# Patient Record
Sex: Female | Born: 1945 | Race: White | Hispanic: No
Health system: Southern US, Community
[De-identification: ages and names within clinical notes are randomized; demographics above are authoritative.]

## PROBLEM LIST (undated history)

## (undated) DIAGNOSIS — R7401 Elevation of levels of liver transaminase levels: Secondary | ICD-10-CM

## (undated) DIAGNOSIS — L659 Nonscarring hair loss, unspecified: Secondary | ICD-10-CM

## (undated) DIAGNOSIS — E119 Type 2 diabetes mellitus without complications: Secondary | ICD-10-CM

## (undated) DIAGNOSIS — I4891 Unspecified atrial fibrillation: Secondary | ICD-10-CM

## (undated) DIAGNOSIS — I10 Essential (primary) hypertension: Secondary | ICD-10-CM

## (undated) DIAGNOSIS — R740 Nonspecific elevation of levels of transaminase and lactic acid dehydrogenase [LDH]: Secondary | ICD-10-CM

---

## 2014-03-21 ENCOUNTER — Other Ambulatory Visit (HOSPITAL_COMMUNITY): Payer: Medicare Other

## 2014-03-21 ENCOUNTER — Inpatient Hospital Stay
Admission: AD | Admit: 2014-03-21 | Discharge: 2014-03-27 | Disposition: A | Discharge status: Nursing Home (Includes ICF) | Payer: Medicare Other | Source: Ambulatory Visit | Attending: Internal Medicine | Admitting: Internal Medicine

## 2014-03-21 DIAGNOSIS — K56609 Unspecified intestinal obstruction, unspecified as to partial versus complete obstruction: Secondary | ICD-10-CM

## 2014-03-21 DIAGNOSIS — Z4659 Encounter for fitting and adjustment of other gastrointestinal appliance and device: Secondary | ICD-10-CM

## 2014-03-21 DIAGNOSIS — R638 Other symptoms and signs concerning food and fluid intake: Secondary | ICD-10-CM

## 2014-03-21 DIAGNOSIS — Z978 Presence of other specified devices: Secondary | ICD-10-CM

## 2014-03-21 DIAGNOSIS — Z9911 Dependence on respirator [ventilator] status: Secondary | ICD-10-CM

## 2014-03-21 DIAGNOSIS — R069 Unspecified abnormalities of breathing: Secondary | ICD-10-CM

## 2014-03-21 DIAGNOSIS — J9690 Respiratory failure, unspecified, unspecified whether with hypoxia or hypercapnia: Secondary | ICD-10-CM

## 2014-03-21 DIAGNOSIS — K567 Ileus, unspecified: Secondary | ICD-10-CM

## 2014-03-21 DIAGNOSIS — H5000 Unspecified esotropia: Secondary | ICD-10-CM

## 2014-03-21 DIAGNOSIS — J9691 Respiratory failure, unspecified with hypoxia: Secondary | ICD-10-CM

## 2014-03-21 DIAGNOSIS — J9692 Respiratory failure, unspecified with hypercapnia: Secondary | ICD-10-CM

## 2014-03-21 DIAGNOSIS — J9600 Acute respiratory failure, unspecified whether with hypoxia or hypercapnia: Secondary | ICD-10-CM

## 2014-03-21 LAB — BLOOD GAS, ARTERIAL
Acid-Base Excess: 5.1 mmol/L — ABNORMAL HIGH (ref 0.0–2.0)
BICARBONATE: 29.8 meq/L — AB (ref 20.0–24.0)
FIO2: 0.4 %
MECHVT: 450 mL
O2 SAT: 96.4 %
PATIENT TEMPERATURE: 98.6
PEEP: 5 cmH2O
RATE: 16 resp/min
TCO2: 31.3 mmol/L (ref 0–100)
pCO2 arterial: 49.9 mmHg — ABNORMAL HIGH (ref 35.0–45.0)
pH, Arterial: 7.393 (ref 7.350–7.450)
pO2, Arterial: 86.4 mmHg (ref 80.0–100.0)

## 2014-03-22 ENCOUNTER — Other Ambulatory Visit (HOSPITAL_COMMUNITY): Payer: Medicare Other

## 2014-03-22 LAB — COMPREHENSIVE METABOLIC PANEL
ALT: 76 U/L — ABNORMAL HIGH (ref 0–35)
ANION GAP: 11 (ref 5–15)
AST: 24 U/L (ref 0–37)
Albumin: 2 g/dL — ABNORMAL LOW (ref 3.5–5.2)
Alkaline Phosphatase: 84 U/L (ref 39–117)
BUN: 23 mg/dL (ref 6–23)
CALCIUM: 8.9 mg/dL (ref 8.4–10.5)
CO2: 31 mEq/L (ref 19–32)
CREATININE: 0.87 mg/dL (ref 0.50–1.10)
Chloride: 94 mEq/L — ABNORMAL LOW (ref 96–112)
GFR calc non Af Amer: 66 mL/min — ABNORMAL LOW (ref >90)
GFR, EST AFRICAN AMERICAN: 77 mL/min — AB (ref >90)
Glucose, Bld: 222 mg/dL — ABNORMAL HIGH (ref 70–99)
Potassium: 3.5 mEq/L — ABNORMAL LOW (ref 3.7–5.3)
Sodium: 136 mEq/L — ABNORMAL LOW (ref 137–147)
TOTAL PROTEIN: 6.7 g/dL (ref 6.0–8.3)
Total Bilirubin: 0.9 mg/dL (ref 0.3–1.2)

## 2014-03-22 LAB — PROCALCITONIN: Procalcitonin: 0.26 ng/mL

## 2014-03-22 LAB — CBC
HEMATOCRIT: 35.7 % — AB (ref 36.0–46.0)
HEMOGLOBIN: 11.4 g/dL — AB (ref 12.0–15.0)
MCH: 30.9 pg (ref 26.0–34.0)
MCHC: 31.9 g/dL (ref 30.0–36.0)
MCV: 96.7 fL (ref 78.0–100.0)
Platelets: 293 10*3/uL (ref 150–400)
RBC: 3.69 MIL/uL — ABNORMAL LOW (ref 3.87–5.11)
RDW: 14.6 % (ref 11.5–15.5)
WBC: 15.5 10*3/uL — ABNORMAL HIGH (ref 4.0–10.5)

## 2014-03-22 LAB — PREALBUMIN: Prealbumin: 7.2 mg/dL — ABNORMAL LOW (ref 17.0–34.0)

## 2014-03-23 ENCOUNTER — Other Ambulatory Visit (HOSPITAL_COMMUNITY): Payer: Medicare Other

## 2014-03-23 LAB — BASIC METABOLIC PANEL
Anion gap: 13 (ref 5–15)
BUN: 28 mg/dL — AB (ref 6–23)
CALCIUM: 8.9 mg/dL (ref 8.4–10.5)
CO2: 31 meq/L (ref 19–32)
Chloride: 95 mEq/L — ABNORMAL LOW (ref 96–112)
Creatinine, Ser: 0.94 mg/dL (ref 0.50–1.10)
GFR calc Af Amer: 70 mL/min — ABNORMAL LOW (ref >90)
GFR, EST NON AFRICAN AMERICAN: 61 mL/min — AB (ref >90)
GLUCOSE: 246 mg/dL — AB (ref 70–99)
Potassium: 3.8 mEq/L (ref 3.7–5.3)
Sodium: 139 mEq/L (ref 137–147)

## 2014-03-23 LAB — MAGNESIUM: Magnesium: 2.2 mg/dL (ref 1.5–2.5)

## 2014-03-24 ENCOUNTER — Other Ambulatory Visit (HOSPITAL_COMMUNITY): Payer: Medicare Other

## 2014-03-24 LAB — COMPREHENSIVE METABOLIC PANEL
ALK PHOS: 83 U/L (ref 39–117)
ALT: 93 U/L — ABNORMAL HIGH (ref 0–35)
AST: 67 U/L — ABNORMAL HIGH (ref 0–37)
Albumin: 2 g/dL — ABNORMAL LOW (ref 3.5–5.2)
Anion gap: 9 (ref 5–15)
BUN: 23 mg/dL (ref 6–23)
CO2: 33 mEq/L — ABNORMAL HIGH (ref 19–32)
Calcium: 8.6 mg/dL (ref 8.4–10.5)
Chloride: 100 mEq/L (ref 96–112)
Creatinine, Ser: 0.8 mg/dL (ref 0.50–1.10)
GFR calc non Af Amer: 74 mL/min — ABNORMAL LOW (ref >90)
GFR, EST AFRICAN AMERICAN: 85 mL/min — AB (ref >90)
GLUCOSE: 204 mg/dL — AB (ref 70–99)
Potassium: 3.7 mEq/L (ref 3.7–5.3)
Sodium: 142 mEq/L (ref 137–147)
TOTAL PROTEIN: 6.2 g/dL (ref 6.0–8.3)
Total Bilirubin: 0.9 mg/dL (ref 0.3–1.2)

## 2014-03-24 LAB — BASIC METABOLIC PANEL
Anion gap: 9 (ref 5–15)
BUN: 24 mg/dL — AB (ref 6–23)
CO2: 33 mEq/L — ABNORMAL HIGH (ref 19–32)
CREATININE: 0.85 mg/dL (ref 0.50–1.10)
Calcium: 8.8 mg/dL (ref 8.4–10.5)
Chloride: 99 mEq/L (ref 96–112)
GFR calc Af Amer: 79 mL/min — ABNORMAL LOW (ref >90)
GFR, EST NON AFRICAN AMERICAN: 68 mL/min — AB (ref >90)
Glucose, Bld: 230 mg/dL — ABNORMAL HIGH (ref 70–99)
Potassium: 3.5 mEq/L — ABNORMAL LOW (ref 3.7–5.3)
Sodium: 141 mEq/L (ref 137–147)

## 2014-03-24 LAB — TRIGLYCERIDES: Triglycerides: 123 mg/dL (ref <150)

## 2014-03-24 LAB — CBC
HEMATOCRIT: 36.2 % (ref 36.0–46.0)
HEMOGLOBIN: 11.4 g/dL — AB (ref 12.0–15.0)
MCH: 31.2 pg (ref 26.0–34.0)
MCHC: 31.5 g/dL (ref 30.0–36.0)
MCV: 99.2 fL (ref 78.0–100.0)
Platelets: 363 10*3/uL (ref 150–400)
RBC: 3.65 MIL/uL — ABNORMAL LOW (ref 3.87–5.11)
RDW: 14.8 % (ref 11.5–15.5)
WBC: 11.2 10*3/uL — ABNORMAL HIGH (ref 4.0–10.5)

## 2014-03-24 LAB — PHOSPHORUS: Phosphorus: 2.7 mg/dL (ref 2.3–4.6)

## 2014-03-24 LAB — MAGNESIUM: MAGNESIUM: 2.1 mg/dL (ref 1.5–2.5)

## 2014-03-24 NOTE — Consult Note (Signed)
Name: Amanda Yang MRN: [id number] DOB: [birth year]    ADMISSION DATE:  03/21/2014 CONSULTATION DATE:  10/26  REFERRING MD :  Sharyon MedicusHijazi   CHIEF COMPLAINT:  Vent weaning  BRIEF PATIENT DESCRIPTION:  68 [facility] w/ acute hypoxic and hypercarbic respiratory failure in the setting of RLL PNA  And AMS. She failed NIPPV attempt and required intubation and admit to the ICU. Sputum culture demonstrated Group B strep and she was treated w/ appropriate antibiotic rx. She had passed SBT and was initially extubated on 10/20. She developed progressive worsening hypercarbia, once again failed BIPAP so was re-intubated. [facility] and possible consideration for trach.   SIGNIFICANT EVENTS  10/15: admitted to [place] w/ PNA, HCRF, AF w/ RVR and AMS  10/20: extubated, developed progressive HCRF, reintubated.  10/23: [facility] and possible trach   STUDIES:  [facility] negative for acute process    HISTORY OF PRESENT ILLNESS:   68 [facility] old [facility] w/ acute hypoxic and hypercarbic respiratory failure in the setting of RLL PNA  And AMS. She failed NIPPV attempt and required intubation and admit to the ICU. Sputum culture demonstrated Group B strep and she was treated w/ appropriate antibiotic rx. She had passed SBT and was initially extubated on 10/20. She developed progressive  worsening hypercarbia, once again failed BIPAP so was re-intubated. [facility] and possible consideration for trach.   PAST MEDICAL HISTORY :  Acute hypoxic and hypercarbic respiratory failure, CAP (gp B strep), prob OSA, atrial fib w/ episode of RVR, AKI, transeminitis, uncontrolled DM type II, HTN, morbid obesity, baseline cognition impairment, ileus, alopecia    medications   Digoxin, duoneb, lactulose, lemivir, reglan,  lisinopril, LMWH, pro-stat, protonix, Unasyn D51/2 NS, PRN fentanyl, lorazepam, oxy IR.    Allergies not on file Grass mix pollens   FAMILY HISTORY:  family history is not on file. SOCIAL HISTORY:   Lives alone. Has mild cognitive impairment but was able to work for ~ 30 years, drives, manages her own meds. Her brother is her medical POA. Gabriel Earingalvin Caspers.    REVIEW OF SYSTEMS:   No distress.   SUBJECTIVE:  No distress.  VITAL SIGNS:   Reviewed, sats 98%, afebrile  PHYSICAL EXAMINATION: General:  Obese no distress  Neuro:  Sleepy, appropriate. No distress.  HEENT:  Orally intubated. No JVD, alopecia  Cardiovascular:  Regular irregular  Lungs:  Scattered rhonchi w/out accessory muscle use. Only pulls ~ 300 ml vt w/ rr ~ 30 on 16 cmH2O support Abdomen:  Obese, distended, Hypoactive  Musculoskeletal:  Intact  Skin:  + anasarca, warm to touch    Recent Labs Lab 03/22/14 0640 03/23/14 0600 03/24/14 0500  NA 136* 139 141  K 3.5* 3.8 3.5*  CL 94* 95* 99  CO2 31 31 33*  BUN 23 28* 24*  CREATININE 0.87 0.94 0.85  GLUCOSE 222* 246* 230*    Recent Labs Lab 03/22/14 0640 03/24/14 0500  HGB 11.4* 11.4*  HCT 35.7* 36.2  WBC 15.5* 11.2*  PLT 293 363   ABG    Component Value Date/Time   PHART 7.393 03/21/2014 1905   PCO2ART 49.9* 03/21/2014 1905   PO2ART 86.4 03/21/2014 1905   HCO3 29.8* 03/21/2014 1905   TCO2 31.3 03/21/2014 1905   O2SAT 96.4 03/21/2014 1905    Dg  Chest Port 1 View  03/24/2014   CLINICAL DATA:  Respiratory failure.  EXAM: PORTABLE CHEST - 1 VIEW  COMPARISON:  03/22/2014  FINDINGS: Endotracheal tube is roughly 3.0 cm above the carina. Central line tip in the upper SVC region. Lung markings are mildly prominent but there is no focal airspace disease. Heart size is normal. Nasogastric tube extends into the abdomen. Negative for a pneumothorax.  IMPRESSION: Stable chest radiograph findings. Prominent lung markings without focal airspace disease.  Stable  support apparatuses.   Electronically Signed   By: Richarda OverlieAdam  Henn M.D.   On: 03/24/2014 08:08   Dg Abd Portable 1v  03/23/2014   CLINICAL DATA:  Ileus, abdominal distension  EXAM: PORTABLE ABDOMEN - 1 VIEW  COMPARISON:  Portable exam 1404 hr compared to 03/21/2014  FINDINGS: Air-filled large and small bowel loops throughout abdomen question ileus.  No bowel wall thickening or point of obstruction.  Tip of nasogastric tube projects over proximal descending duodenum.  Improved aeration in LEFT lower lobe versus previous exam.  Bones demineralized.  Scattered atherosclerotic calcification.  IMPRESSION: Question ileus.   Electronically Signed   By: Ulyses SouthwardMark  Boles M.D.   On: 03/23/2014 14:27  R>L airspace disease   ASSESSMENT / PLAN:  Acute on chronic hypoxic and hypercarbic respiratory failure Group B strep PNA. W/ persistent R basilar opacity  Probable OSA/OHS Suspect diastolic dysfxn and probable secondary PAH Ileus  Acute encephalopathy  DM w/ hyperglycemia  Anemia of critical illness Mild hypokalemia   Discussion Long interview w/ her brother Jerilynn SomCalvin. She lives independently, was still driving and managing her household semi-independently prior to [facility] [place] [facility]. She had about a 3 week decline w/ what sounds more consistent w/ decompensated OHS/OSA, then probably a super-imposed CAP. Currently she is on PSSV of 12 6 cmH2O. Her Vt on this barely bring her F/vt ratio less than 100. Certainly her ileus could be contributing to this; but suspect we are dealing more w/ a chronic problem of Obstructive sleep apnea +/- obesity hypoventilation syndrome. She is able to manage her own insulin at home. Very functional. Think that she would be able to provide trach care for herself if we go this route. The question would be if she needs nocturnal ventilation as well.   recs [facility], to goal 5/5 Continue to work on ileus: limit sedation, continue pro motility agents, mobilize Continue  ABX per primary service. Given nml PCT these could be stopped at anytime  Shoot for negative fluid balance if BUN.creatine and BP allow. Appears has plenty of room Think trach in next few days would be helpful, eventually decide on nocturnal vent or not We will need to call her Bother Gabriel EaringCalvin Stave [phone] prior to trach  Will need coags  Pulmonary and Critical Care Medicine [facility] Pager: [phone]  03/24/2014, 10:29 AM  I have fully examined this patient and agree with above findings.    And edited infull  Mcarthur RossettiDaniel J. Tyson AliasFeinstein, MD, FACP Pgr: [phone] [place] [facility]

## 2014-03-25 ENCOUNTER — Other Ambulatory Visit (HOSPITAL_COMMUNITY): Payer: Medicare Other

## 2014-03-25 DIAGNOSIS — Z789 Other specified health status: Secondary | ICD-10-CM

## 2014-03-25 DIAGNOSIS — R638 Other symptoms and signs concerning food and fluid intake: Secondary | ICD-10-CM

## 2014-03-25 DIAGNOSIS — Z4659 Encounter for fitting and adjustment of other gastrointestinal appliance and device: Secondary | ICD-10-CM

## 2014-03-25 DIAGNOSIS — H5000 Unspecified esotropia: Secondary | ICD-10-CM

## 2014-03-25 DIAGNOSIS — J9600 Acute respiratory failure, unspecified whether with hypoxia or hypercapnia: Secondary | ICD-10-CM

## 2014-03-25 LAB — CULTURE, RESPIRATORY

## 2014-03-25 LAB — CULTURE, RESPIRATORY W GRAM STAIN

## 2014-03-26 ENCOUNTER — Other Ambulatory Visit (HOSPITAL_COMMUNITY): Payer: Medicare Other

## 2014-03-26 LAB — BASIC METABOLIC PANEL
Anion gap: 8 (ref 5–15)
BUN: 27 mg/dL — ABNORMAL HIGH (ref 6–23)
CO2: 42 meq/L — AB (ref 19–32)
Calcium: 8.8 mg/dL (ref 8.4–10.5)
Chloride: 92 mEq/L — ABNORMAL LOW (ref 96–112)
Creatinine, Ser: 0.89 mg/dL (ref 0.50–1.10)
GFR calc Af Amer: 75 mL/min — ABNORMAL LOW (ref >90)
GFR calc non Af Amer: 65 mL/min — ABNORMAL LOW (ref >90)
Glucose, Bld: 214 mg/dL — ABNORMAL HIGH (ref 70–99)
Potassium: 3.2 mEq/L — ABNORMAL LOW (ref 3.7–5.3)
Sodium: 142 mEq/L (ref 137–147)

## 2014-03-26 LAB — CLOSTRIDIUM DIFFICILE BY PCR: CDIFFPCR: NEGATIVE

## 2014-03-26 LAB — PHOSPHORUS: Phosphorus: 3.4 mg/dL (ref 2.3–4.6)

## 2014-03-26 LAB — MAGNESIUM: Magnesium: 1.4 mg/dL — ABNORMAL LOW (ref 1.5–2.5)

## 2014-03-27 ENCOUNTER — Other Ambulatory Visit (HOSPITAL_COMMUNITY): Payer: Medicare Other

## 2014-03-27 ENCOUNTER — Encounter (HOSPITAL_COMMUNITY): Payer: Self-pay | Admitting: Adult Health

## 2014-03-27 ENCOUNTER — Inpatient Hospital Stay (HOSPITAL_COMMUNITY)
Admission: EM | Admit: 2014-03-27 | Discharge: 2014-04-02 | DRG: 207 | Disposition: A | Discharge status: Other Acute Inpatient Hospital | Payer: Medicare Other | Attending: Pulmonary Disease | Admitting: Pulmonary Disease

## 2014-03-27 DIAGNOSIS — I10 Essential (primary) hypertension: Secondary | ICD-10-CM | POA: Diagnosis present

## 2014-03-27 DIAGNOSIS — E871 Hypo-osmolality and hyponatremia: Secondary | ICD-10-CM | POA: Diagnosis present

## 2014-03-27 DIAGNOSIS — Z9911 Dependence on respirator [ventilator] status: Secondary | ICD-10-CM | POA: Diagnosis not present

## 2014-03-27 DIAGNOSIS — K8020 Calculus of gallbladder without cholecystitis without obstruction: Secondary | ICD-10-CM | POA: Diagnosis present

## 2014-03-27 DIAGNOSIS — I4891 Unspecified atrial fibrillation: Secondary | ICD-10-CM

## 2014-03-27 DIAGNOSIS — G9341 Metabolic encephalopathy: Secondary | ICD-10-CM | POA: Diagnosis present

## 2014-03-27 DIAGNOSIS — R40244 Other coma, without documented Glasgow coma scale score, or with partial score reported, unspecified time: Secondary | ICD-10-CM

## 2014-03-27 DIAGNOSIS — J15212 Pneumonia due to Methicillin resistant Staphylococcus aureus: Secondary | ICD-10-CM | POA: Diagnosis present

## 2014-03-27 DIAGNOSIS — Z22322 Carrier or suspected carrier of Methicillin resistant Staphylococcus aureus: Secondary | ICD-10-CM | POA: Diagnosis not present

## 2014-03-27 DIAGNOSIS — D649 Anemia, unspecified: Secondary | ICD-10-CM | POA: Diagnosis present

## 2014-03-27 DIAGNOSIS — R791 Abnormal coagulation profile: Secondary | ICD-10-CM | POA: Diagnosis present

## 2014-03-27 DIAGNOSIS — Z6836 Body mass index (BMI) 36.0-36.9, adult: Secondary | ICD-10-CM

## 2014-03-27 DIAGNOSIS — N179 Acute kidney failure, unspecified: Secondary | ICD-10-CM | POA: Diagnosis present

## 2014-03-27 DIAGNOSIS — J90 Pleural effusion, not elsewhere classified: Secondary | ICD-10-CM

## 2014-03-27 DIAGNOSIS — R404 Transient alteration of awareness: Secondary | ICD-10-CM

## 2014-03-27 DIAGNOSIS — G4733 Obstructive sleep apnea (adult) (pediatric): Secondary | ICD-10-CM | POA: Diagnosis present

## 2014-03-27 DIAGNOSIS — E119 Type 2 diabetes mellitus without complications: Secondary | ICD-10-CM | POA: Diagnosis present

## 2014-03-27 DIAGNOSIS — R109 Unspecified abdominal pain: Secondary | ICD-10-CM | POA: Diagnosis present

## 2014-03-27 DIAGNOSIS — R0603 Acute respiratory distress: Secondary | ICD-10-CM

## 2014-03-27 DIAGNOSIS — Z4659 Encounter for fitting and adjustment of other gastrointestinal appliance and device: Secondary | ICD-10-CM

## 2014-03-27 DIAGNOSIS — R6520 Severe sepsis without septic shock: Secondary | ICD-10-CM

## 2014-03-27 DIAGNOSIS — J9601 Acute respiratory failure with hypoxia: Secondary | ICD-10-CM

## 2014-03-27 DIAGNOSIS — J9620 Acute and chronic respiratory failure, unspecified whether with hypoxia or hypercapnia: Secondary | ICD-10-CM | POA: Insufficient documentation

## 2014-03-27 DIAGNOSIS — A419 Sepsis, unspecified organism: Secondary | ICD-10-CM

## 2014-03-27 DIAGNOSIS — J9690 Respiratory failure, unspecified, unspecified whether with hypoxia or hypercapnia: Secondary | ICD-10-CM

## 2014-03-27 DIAGNOSIS — R4182 Altered mental status, unspecified: Secondary | ICD-10-CM

## 2014-03-27 DIAGNOSIS — J9600 Acute respiratory failure, unspecified whether with hypoxia or hypercapnia: Secondary | ICD-10-CM

## 2014-03-27 HISTORY — DX: Unspecified atrial fibrillation: I48.91

## 2014-03-27 HISTORY — DX: Type 2 diabetes mellitus without complications: E11.9

## 2014-03-27 HISTORY — DX: Elevation of levels of liver transaminase levels: R74.01

## 2014-03-27 HISTORY — DX: Essential (primary) hypertension: I10

## 2014-03-27 HISTORY — DX: Nonspecific elevation of levels of transaminase and lactic acid dehydrogenase (ldh): R74.0

## 2014-03-27 HISTORY — DX: Morbid (severe) obesity due to excess calories: E66.01

## 2014-03-27 HISTORY — DX: Nonscarring hair loss, unspecified: L65.9

## 2014-03-27 LAB — URINALYSIS, ROUTINE W REFLEX MICROSCOPIC
Bilirubin Urine: NEGATIVE
Glucose, UA: NEGATIVE mg/dL
KETONES UR: 15 mg/dL — AB
NITRITE: NEGATIVE
Protein, ur: 30 mg/dL — AB
Specific Gravity, Urine: 1.041 — ABNORMAL HIGH (ref 1.005–1.030)
Urobilinogen, UA: 2 mg/dL — ABNORMAL HIGH (ref 0.0–1.0)
pH: 6 (ref 5.0–8.0)

## 2014-03-27 LAB — URINE MICROSCOPIC-ADD ON

## 2014-03-27 LAB — CBC
HCT: 33.3 % — ABNORMAL LOW (ref 36.0–46.0)
HCT: 35.4 % — ABNORMAL LOW (ref 36.0–46.0)
Hemoglobin: 10.1 g/dL — ABNORMAL LOW (ref 12.0–15.0)
Hemoglobin: 11.2 g/dL — ABNORMAL LOW (ref 12.0–15.0)
MCH: 29.9 pg (ref 26.0–34.0)
MCH: 31.7 pg (ref 26.0–34.0)
MCHC: 30.3 g/dL (ref 30.0–36.0)
MCHC: 31.6 g/dL (ref 30.0–36.0)
MCV: 98.5 fL (ref 78.0–100.0)
MCV: 100.3 fL — ABNORMAL HIGH (ref 78.0–100.0)
PLATELETS: 372 10*3/uL (ref 150–400)
Platelets: 389 10*3/uL (ref 150–400)
RBC: 3.38 MIL/uL — ABNORMAL LOW (ref 3.87–5.11)
RBC: 3.53 MIL/uL — AB (ref 3.87–5.11)
RDW: 14.7 % (ref 11.5–15.5)
RDW: 14.7 % (ref 11.5–15.5)
WBC: 9.1 10*3/uL (ref 4.0–10.5)
WBC: 9.6 10*3/uL (ref 4.0–10.5)

## 2014-03-27 LAB — PROTIME-INR
INR: 1.18 (ref 0.00–1.49)
PROTHROMBIN TIME: 15.1 s (ref 11.6–15.2)

## 2014-03-27 LAB — POCT I-STAT 3, ART BLOOD GAS (G3+)
ACID-BASE EXCESS: 20 mmol/L — AB (ref 0.0–2.0)
Bicarbonate: 46.3 mEq/L — ABNORMAL HIGH (ref 20.0–24.0)
O2 SAT: 93 %
TCO2: 48 mmol/L (ref 0–100)
pCO2 arterial: 56.6 mmHg — ABNORMAL HIGH (ref 35.0–45.0)
pH, Arterial: 7.521 — ABNORMAL HIGH (ref 7.350–7.450)
pO2, Arterial: 64 mmHg — ABNORMAL LOW (ref 80.0–100.0)

## 2014-03-27 LAB — PHOSPHORUS
PHOSPHORUS: 3.4 mg/dL (ref 2.3–4.6)
Phosphorus: 3.1 mg/dL (ref 2.3–4.6)

## 2014-03-27 LAB — BASIC METABOLIC PANEL
Anion gap: 8 (ref 5–15)
BUN: 27 mg/dL — ABNORMAL HIGH (ref 6–23)
CALCIUM: 8.5 mg/dL (ref 8.4–10.5)
CO2: 43 mEq/L (ref 19–32)
Chloride: 89 mEq/L — ABNORMAL LOW (ref 96–112)
Creatinine, Ser: 0.87 mg/dL (ref 0.50–1.10)
GFR calc Af Amer: 77 mL/min — ABNORMAL LOW (ref >90)
GFR calc non Af Amer: 66 mL/min — ABNORMAL LOW (ref >90)
GLUCOSE: 262 mg/dL — AB (ref 70–99)
Potassium: 3.2 mEq/L — ABNORMAL LOW (ref 3.7–5.3)
SODIUM: 140 meq/L (ref 137–147)

## 2014-03-27 LAB — HEPATIC FUNCTION PANEL
ALT: 157 U/L — ABNORMAL HIGH (ref 0–35)
AST: 95 U/L — ABNORMAL HIGH (ref 0–37)
Albumin: 2 g/dL — ABNORMAL LOW (ref 3.5–5.2)
Alkaline Phosphatase: 77 U/L (ref 39–117)
Bilirubin, Direct: 0.4 mg/dL — ABNORMAL HIGH (ref 0.0–0.3)
Indirect Bilirubin: 0.4 mg/dL (ref 0.3–0.9)
Total Bilirubin: 0.8 mg/dL (ref 0.3–1.2)
Total Protein: 6.1 g/dL (ref 6.0–8.3)

## 2014-03-27 LAB — MAGNESIUM
MAGNESIUM: 1.8 mg/dL (ref 1.5–2.5)
Magnesium: 1.8 mg/dL (ref 1.5–2.5)

## 2014-03-27 LAB — APTT: aPTT: 40 seconds — ABNORMAL HIGH (ref 24–37)

## 2014-03-27 LAB — GLUCOSE, CAPILLARY
GLUCOSE-CAPILLARY: 151 mg/dL — AB (ref 70–99)
GLUCOSE-CAPILLARY: 80 mg/dL (ref 70–99)

## 2014-03-27 LAB — PROCALCITONIN: Procalcitonin: 0.39 ng/mL

## 2014-03-27 LAB — MRSA PCR SCREENING: MRSA by PCR: POSITIVE — AB

## 2014-03-27 LAB — AMYLASE: Amylase: 34 U/L (ref 0–105)

## 2014-03-27 LAB — AMMONIA: Ammonia: 32 umol/L (ref 11–60)

## 2014-03-27 LAB — LACTIC ACID, PLASMA: Lactic Acid, Venous: 1.4 mmol/L (ref 0.5–2.2)

## 2014-03-27 LAB — DIGOXIN LEVEL: Digoxin Level: 1.2 ng/mL (ref 0.8–2.0)

## 2014-03-27 LAB — TRIGLYCERIDES: Triglycerides: 113 mg/dL (ref <150)

## 2014-03-27 LAB — LIPASE, BLOOD: LIPASE: 46 U/L (ref 11–59)

## 2014-03-27 MED ORDER — VANCOMYCIN HCL IN DEXTROSE 1-5 GM/200ML-% IV SOLN
1000.0000 mg | Freq: Two times a day (BID) | INTRAVENOUS | Status: DC
  Administered 2014-03-28 – 2014-03-29 (×3): 1000 mg via INTRAVENOUS
  Filled 2014-03-27 (×4): qty 200

## 2014-03-27 MED ORDER — MUPIROCIN 2 % EX OINT
1.0000 "application " | TOPICAL_OINTMENT | Freq: Two times a day (BID) | CUTANEOUS | Status: AC
  Administered 2014-03-27 – 2014-04-01 (×10): 1 via NASAL
  Filled 2014-03-27: qty 22

## 2014-03-27 MED ORDER — POTASSIUM CHLORIDE IN NACL 20-0.9 MEQ/L-% IV SOLN
INTRAVENOUS | Status: DC
  Administered 2014-03-27: 15:00:00 via INTRAVENOUS
  Administered 2014-03-28: 1000 mL via INTRAVENOUS
  Filled 2014-03-27 (×3): qty 1000

## 2014-03-27 MED ORDER — IPRATROPIUM-ALBUTEROL 0.5-2.5 (3) MG/3ML IN SOLN: 3.0000 mL | Freq: Four times a day (QID) | RESPIRATORY_TRACT | Status: DC

## 2014-03-27 MED ORDER — IOHEXOL 300 MG/ML  SOLN
100.0000 mL | Freq: Once | INTRAMUSCULAR | Status: AC | PRN
  Administered 2014-03-27: 100 mL via INTRAVENOUS

## 2014-03-27 MED ORDER — CETYLPYRIDINIUM CHLORIDE 0.05 % MT LIQD
7.0000 mL | Freq: Four times a day (QID) | OROMUCOSAL | Status: DC
  Administered 2014-03-28 – 2014-04-02 (×24): 7 mL via OROMUCOSAL

## 2014-03-27 MED ORDER — CHLORHEXIDINE GLUCONATE 0.12 % MT SOLN
15.0000 mL | Freq: Two times a day (BID) | OROMUCOSAL | Status: DC
  Administered 2014-03-27 – 2014-04-02 (×12): 15 mL via OROMUCOSAL
  Filled 2014-03-27 (×11): qty 15

## 2014-03-27 MED ORDER — PANTOPRAZOLE SODIUM 40 MG IV SOLR
40.0000 mg | Freq: Every day | INTRAVENOUS | Status: DC
  Administered 2014-03-27 – 2014-04-01 (×6): 40 mg via INTRAVENOUS
  Filled 2014-03-27 (×7): qty 40

## 2014-03-27 MED ORDER — PIPERACILLIN-TAZOBACTAM 3.375 G IVPB 30 MIN
3.3750 g | Freq: Once | INTRAVENOUS | Status: AC
  Administered 2014-03-27: 3.375 g via INTRAVENOUS

## 2014-03-27 MED ORDER — DIGOXIN 250 MCG PO TABS
0.2500 mg | ORAL_TABLET | Freq: Every day | ORAL | Status: DC
  Administered 2014-03-27 – 2014-04-01 (×6): 0.25 mg
  Filled 2014-03-27 (×8): qty 1

## 2014-03-27 MED ORDER — CHLORHEXIDINE GLUCONATE 0.12 % MT SOLN
15.0000 mL | Freq: Two times a day (BID) | OROMUCOSAL | Status: DC
Start: 2014-03-27 — End: 2014-03-27
  Administered 2014-03-27: 15 mL via OROMUCOSAL
  Filled 2014-03-27: qty 15

## 2014-03-27 MED ORDER — INSULIN ASPART 100 UNIT/ML ~~LOC~~ SOLN
0.0000 [IU] | SUBCUTANEOUS | Status: DC
  Administered 2014-03-27: 4 [IU] via SUBCUTANEOUS
  Administered 2014-03-28: 3 [IU] via SUBCUTANEOUS
  Administered 2014-03-29: 4 [IU] via SUBCUTANEOUS
  Administered 2014-03-29 – 2014-03-31 (×7): 3 [IU] via SUBCUTANEOUS
  Administered 2014-04-01: 7 [IU] via SUBCUTANEOUS
  Administered 2014-04-01 (×2): 4 [IU] via SUBCUTANEOUS
  Administered 2014-04-01 (×2): 3 [IU] via SUBCUTANEOUS
  Administered 2014-04-01: 4 [IU] via SUBCUTANEOUS
  Administered 2014-04-02: 3 [IU] via SUBCUTANEOUS

## 2014-03-27 MED ORDER — VANCOMYCIN HCL 10 G IV SOLR
2000.0000 mg | INTRAVENOUS | Status: AC
  Administered 2014-03-27: 2000 mg via INTRAVENOUS
  Filled 2014-03-27: qty 2000

## 2014-03-27 MED ORDER — IPRATROPIUM-ALBUTEROL 0.5-2.5 (3) MG/3ML IN SOLN
3.0000 mL | Freq: Four times a day (QID) | RESPIRATORY_TRACT | Status: DC
  Administered 2014-03-27 – 2014-04-02 (×23): 3 mL via RESPIRATORY_TRACT
  Filled 2014-03-27 (×24): qty 3

## 2014-03-27 MED ORDER — CHLORHEXIDINE GLUCONATE CLOTH 2 % EX PADS
6.0000 | MEDICATED_PAD | Freq: Every day | CUTANEOUS | Status: AC
  Administered 2014-03-28 – 2014-04-01 (×5): 6 via TOPICAL

## 2014-03-27 MED ORDER — PIPERACILLIN-TAZOBACTAM 3.375 G IVPB
3.3750 g | Freq: Three times a day (TID) | INTRAVENOUS | Status: DC
  Administered 2014-03-27 – 2014-04-01 (×14): 3.375 g via INTRAVENOUS
  Filled 2014-03-27 (×16): qty 50

## 2014-03-27 MED ORDER — CETYLPYRIDINIUM CHLORIDE 0.05 % MT LIQD: 7.0000 mL | Freq: Four times a day (QID) | OROMUCOSAL | Status: DC

## 2014-03-27 MED ORDER — INSULIN GLARGINE 100 UNIT/ML ~~LOC~~ SOLN
5.0000 [IU] | Freq: Every day | SUBCUTANEOUS | Status: DC
  Administered 2014-03-27 – 2014-04-01 (×6): 5 [IU] via SUBCUTANEOUS
  Filled 2014-03-27 (×7): qty 0.05

## 2014-03-27 MED ORDER — SODIUM CHLORIDE 0.9 % IV SOLN: 250.0000 mL | INTRAVENOUS | Status: DC | PRN

## 2014-03-27 NOTE — H&P (Signed)
PULMONARY / CRITICAL CARE MEDICINE   Name: Amanda Yang MRN: [id number] DOB: [birth year]    ADMISSION DATE:  03/27/2014  REFERRING MD :  Admit from [facility]   CHIEF COMPLAINT:  ?necrotic bowel  INITIAL PRESENTATION:  68yo female initially admitted to [facility] with ETT for vent wean/ ?trach in setting PNA, Afib RVR.  Developed worsening AMS and n/v and CT abd concerning for necrotic bowel.  Pt tx 10/29 to [facility] ER for surgical eval/ admission.    STUDIES:  10/29 CT Ab/Pelvis >>  Free air from possible necrotic small bowel, cholilithiasis, small bilateral pleural effusions and bibasilar atelectasis   SIGNIFICANT EVENTS: 10/29 tx to [facility] from [facility]    HISTORY OF PRESENT ILLNESS:   68yo female with hx DM, AFib, AKI, HTN, baseline cognitive impairment initially admitted to [facility] 10/15 with acute respiratory failure r/t PNA.  Failed bipap, requiring intubation, rx with abx for Group B strep but failed attempt at extubation.  Re-intubated and tx to [facility] 10/23 for weaning and ?trach.  Course has been c/b AKI, metabolic encephalopathy, transaminitis.  While in [facility] developed nausea and vomiting, Abd Xray revealed ?SBO and CT abd was done which was concerning for ischemic bowel and pt was tx to [place] for admission and surgical evaluation.    PAST MEDICAL HISTORY :   has a past medical history of Hypertension; A-fib; Diabetes; Morbid obesity; Alopecia; and Transaminitis.  has no past surgical history on file.   Prior to Admission medications   Not on File   Allergies  Allergen Reactions  . Pollen Extract     FAMILY HISTORY:  has no family status information on file.  SOCIAL HISTORY:  unknown   REVIEW OF SYSTEMS:  Unable, AMS  SUBJECTIVE:   VITAL SIGNS: Temp:  [99.8 F (37.7 C)] 99.8 F (37.7 C) (10/29 1327) Pulse Rate:  [90-103] 103 (10/29 1437) Resp:  [16-19] 19 (10/29 1437) BP: (103-118)/(65-67) 106/67 mmHg (10/29 1437) SpO2:  [95 %-99 %] 95 % (10/29  1437) FiO2 (%):  [35 %] 35 % (10/29 1437) Weight:  [240 lb (108.863 kg)] 240 lb (108.863 kg) (10/29 1327) HEMODYNAMICS:   VENTILATOR SETTINGS: Vent Mode:  [-] PRVC FiO2 (%):  [35 %] 35 % Set Rate:  [16 bmp] 16 bmp Vt Set:  [450 mL] 450 mL PEEP:  [5 cmH20] 5 cmH20 Plateau Pressure:  [23 cmH20] 23 cmH20 INTAKE / OUTPUT: No intake or output data in the 24 hours ending 03/27/14 1522  PHYSICAL EXAMINATION: General:  Ill-appearing obese female, appears older than stated age, vented Neuro:  Responsive to pain, pupils 2mm and minimally responsive to light  HEENT:  Oral mucosa dry, lips cracked and peeling, no scleral icterus, crusty drainage in R ear. Cardiovascular:  RRR, no MGR Lungs:  Diminished at the bases, crackles on the RLL with intermittent wheeze Abdomen:  Obese, firm, +BS Muscleskeletal:  Moves all four extremities (not purposely) in response to pain Skin:  Face pale, dry, pitting edema in BL UE 2+, LE 1+, hyperpigmentation to LE,   LABS:  CBC  Recent Labs Lab 03/24/14 0500 03/27/14 1300 03/27/14 1434  WBC 11.2* 9.1 9.6  HGB 11.4* 10.1* 11.2*  HCT 36.2 33.3* 35.4*  PLT 363 389 372   Coag's  Recent Labs Lab 03/27/14 0500  APTT 40*  INR 1.18   BMET  Recent Labs Lab 03/24/14 1200 03/26/14 0815 03/27/14 0500  NA 142 142 140  K 3.7 3.2* 3.2*  CL 100 92*  89*  CO2 33* 42* 43*  BUN 23 27* 27*  CREATININE 0.80 0.89 0.87  GLUCOSE 204* 214* 262*   Electrolytes  Recent Labs Lab 03/24/14 1200 03/26/14 0815 03/27/14 0500 03/27/14 1434  CALCIUM 8.6 8.8 8.5  --   MG 2.1 1.4* 1.8 1.8  PHOS 2.7 3.4 3.1 3.4   Sepsis Markers  Recent Labs Lab 03/22/14 0640 03/27/14 1434  LATICACIDVEN  --  1.4  PROCALCITON 0.26  --    ABG  Recent Labs Lab 03/21/14 1905  PHART 7.393  PCO2ART 49.9*  PO2ART 86.4   Liver Enzymes  Recent Labs Lab 03/22/14 0640 03/24/14 1200 03/27/14 0500  AST 24 67* 95*  ALT 76* 93* 157*  ALKPHOS 84 83 77  BILITOT 0.9 0.9  0.8  ALBUMIN 2.0* 2.0* 2.0*   Cardiac Enzymes No results found for this basename: TROPONINI, PROBNP,  in the last 168 hours Glucose No results found for this basename: GLUCAP,  in the last 168 hours  Imaging Dg Chest Port 1 View  03/26/2014   CLINICAL DATA:  Respirator weaning, shortness of breath.  EXAM: PORTABLE CHEST - 1 VIEW  COMPARISON:  03/25/2014  FINDINGS: Endotracheal tube tip 2.3 cm proximal to the carina. Right IJ catheter tip projects over the proximal SVC. NG tube descends below the level of the image. Aortic tortuosity. Heart size upper normal to mildly enlarged. Mild interstitial and hazy right greater than left infrahilar airspace opacities. Small effusions not excluded. No pneumothorax. No interval osseous change.  IMPRESSION: Similar appearance to the support devices as above.  Mild infrahilar opacities may reflect atelectasis, infiltrate, and/or edema.   Electronically Signed   By: Jearld LeschAndrew  DelGaizo M.D.   On: 03/26/2014 04:12   ASSESSMENT / PLAN:  PULMONARY OETT 10/20 ([name])>>> A:  Acute on chronic respiratory failure requiring prolonged ventilation RLL PNA OSA with hypoventilation syndrome P:   Fully vented - cont current vent settings  F/u ABG's Serial CXR BD's - duonebs, PRN albuterol  Consider trach   CARDIOVASCULAR CVL R IJ CVL ([name])>>> HTN  AFib RVR - currently NSR, previously on eliquis  SIRS  P:  Cont digoxin  Tele  Hold B blocker, Ace for now with relatively soft BP Check lactate, pct  ??anticoagulation - hold for now with ?need for OR and hematuria but will need to determine anticoagulation needs  RENAL AKI - resolved.  Hyponatremia  Hematuria  P:   F/u chem  Replete K PRN  Check ua  GASTROINTESTINAL ABd pain, n/v  Hx ileus  ?Ischemic bowel - free air on CT abd  transaminitis - suspect r/t hypoperfusion in setting SIRS/sepsis  cholelithiasis  P:   Surgery following  Currently no indication for urgent exp lap - afebrile,  wbc ok, lactate wnl, minimal abd distention Will admit to [facility] ICU for 24-48h to observe for need to possible explore abd F/u LFT's  Check amylase, lipase  NPO for now - previously on TPN r/t concern for un-resolved ileus v SBO  HEMATOLOGIC Coagulopathy - in setting full dose lovenox P:  Check coags  F/u cbc   INFECTIOUS A:   ??Perforated bowel, necrotic bowel Previous RLL PNA P:   BCx2 10/29>>> UC 10/29>>> Sputum 10/24 ([facility])>>> MRSA  Unasyn 10/15>>>10/29 Vanc 10/29>>> Zosyn 10/29>>> Flagyl 10/29>>>  F/u pct  Narrow abx quickly as able -- low threshold to narrow with no fever, wbc ok, s/p 14 day course unasyn    ENDOCRINE DM  P:   SSI, lantus  NEUROLOGIC A:  Metabolic encephalopathy +/- hepatic - appears significantly worse, ?etiology  Baseline cognitive impairment  P:   RASS goal: -1 Hold sedation  Consider CT head  Hold lactulose for now with ??GI status    FAMILY  - Updates: no family available 10/29  Dirk DressKaty Whiteheart, NP 03/27/2014  3:22 PM Pager: [phone] or [phone]  Will admit to the ICU, surgery to evaluate for OR needs.  No signs of active infection at this time but given CT findings will continue with abx.  F/u on cultures.  Will need discussion with family regarding code status prior to proceeding with tracheostomy.  I have personally obtained a history, examined the patient, evaluated laboratory and imaging results, formulated the assessment and plan and placed orders.  CRITICAL CARE: The patient is critically ill with multiple organ systems failure and requires high complexity decision making for assessment and support, frequent evaluation and titration of therapies, application of advanced monitoring technologies and extensive interpretation of multiple databases. Critical Care Time devoted to patient care services described in this note is 45 minutes.   *Care during the described time interval was provided by me and/or other  providers on the critical care team. I have reviewed this patient's available data, including medical history, events of note, physical examination and test results as part of my evaluation.  Alyson ReedyWesam G. Yacoub, M.D. [facility] Pulmonary/Critical Care Medicine. Pager: [phone]. After hours pager: [phone].

## 2014-03-27 NOTE — Consult Note (Signed)
The patient's CT findings are very concerning, although I cannot understand why a CT was done in the first place since clinically she does not seem to have changed.  I was able to communicate with the patient and directly asked her if she were having abdominal pain and she shook her head "no".  I repeated this in front of nursing staff who saw her respond to me.  She has normal active bowel sounds.  She is not acidotic.  She is not hypotensive or requiring pressors.  Her WBC is normal.  At this point her CT findings do not match with her clinical examination, which is benign. We will continue to follow her, but currently there are no indications that she needs surgery.  Marta LamasJames O. Gae BonWyatt, III, MD, FACS [phone]--pager [phone]--office [facility]

## 2014-03-27 NOTE — Progress Notes (Signed)
ANTIBIOTIC CONSULT NOTE - INITIAL  Pharmacy Consult for vancomycin + zosyn Indication: ?perf, ischemic bowel  Allergies  Allergen Reactions  . Pollen Extract     Patient Measurements: Height: 5\' 4"  (162.6 cm) Weight: 240 lb (108.863 kg) IBW/kg (Calculated) : 54.7 Adjusted Body Weight:   Vital Signs: Temp: 99.8 F (37.7 C) (10/29 1327) Temp Source: Axillary (10/29 1327) BP: 103/65 mmHg (10/29 1330) Pulse Rate: 92 (10/29 1330) Intake/Output from previous day:   Intake/Output from this shift:    Labs:  Recent Labs  03/26/14 0815 03/27/14 0500 03/27/14 1300  WBC  --   --  9.1  HGB  --   --  10.1*  PLT  --   --  389  CREATININE 0.89 0.87  --    Estimated Creatinine Clearance: 73.6 ml/min (by C-G formula based on Cr of 0.87). No results found for this basename: VANCOTROUGH, Leodis BinetVANCOPEAK, VANCORANDOM, GENTTROUGH, GENTPEAK, GENTRANDOM, TOBRATROUGH, TOBRAPEAK, TOBRARND, AMIKACINPEAK, AMIKACINTROU, AMIKACIN,  in the last 72 hours   Microbiology: Recent Results (from the past 720 hour(s))  CULTURE, RESPIRATORY (NON-EXPECTORATED)     Status: None   Collection Time    03/22/14  1:49 PM      Result Value Ref Range Status   Specimen Description TRACHEAL ASPIRATE   Final   Special Requests NONE   Final   Gram Stain     Final   Value: FEW WBC PRESENT, PREDOMINANTLY PMN     RARE SQUAMOUS EPITHELIAL CELLS PRESENT     NO ORGANISMS SEEN     Performed at [facility]   Culture     Final   Value: FEW METHICILLIN RESISTANT STAPHYLOCOCCUS AUREUS     Note: RIFAMPIN AND GENTAMICIN SHOULD NOT BE USED AS SINGLE DRUGS FOR TREATMENT OF STAPH INFECTIONS. This organism DOES NOT demonstrate inducible Clindamycin resistance in vitro. CRITICAL RESULT CALLED TO, READ BACK BY AND VERIFIED WITH: S.ADAMS RN @820       10.27.15 BY OANA     Performed at [facility]   Report Status 03/25/2014 FINAL   Final   Organism ID, Bacteria METHICILLIN RESISTANT STAPHYLOCOCCUS AUREUS   Final   CLOSTRIDIUM DIFFICILE BY PCR     Status: None   Collection Time    03/26/14 10:11 AM      Result Value Ref Range Status   C difficile by pcr NEGATIVE  NEGATIVE Final    Medical History: No past medical history on file.  Medications:  Anti-infectives   Start     Dose/Rate Route Frequency Ordered Stop   03/28/14 0300  vancomycin (VANCOCIN) IVPB 1000 mg/200 mL premix     1,000 mg 200 mL/hr over 60 Minutes Intravenous Every 12 hours 03/27/14 1429     03/27/14 2100  piperacillin-tazobactam (ZOSYN) IVPB 3.375 g     3.375 g 12.5 mL/hr over 240 Minutes Intravenous Every 8 hours 03/27/14 1429     03/27/14 1430  vancomycin (VANCOCIN) 2,000 mg in sodium chloride 0.9 % 500 mL IVPB     2,000 mg 250 mL/hr over 120 Minutes Intravenous STAT 03/27/14 1428 03/28/14 1430   03/27/14 1430  piperacillin-tazobactam (ZOSYN) IVPB 3.375 g     3.375 g 100 mL/hr over 30 Minutes Intravenous  Once 03/27/14 1428       Assessment: 68 yof transferred from [facility] to possible bowel perf or ischemic bowel. To start broad-spectrum antibiotics with vancomycin + zosyn for coverage. Pt had previously been on Unasyn prior to transfer. Pt is afebrile and WBC  is WNL. Scr is also WNL.   Vanc 10/29>> Zosyn 10/29>> Unasyn PTA>>10/29  10/24 TA - MRSA 10/28 CD[facility] - NEG  Goal of Therapy:  Vancomycin trough level 15-20 mcg/ml  Plan:  1. Vancomycin 2gm IV x 1 then 1gm IV Q12H 2. Zosyn 3.375gm IV x 1 over 30 min then 3.375gm IV Q8H (4 hr inf) 3. F/u renal fxn, C&S, clinical status and [facility]  Leldon Steege, Drake Leachachel Lynn 03/27/2014,2:30 PM

## 2014-03-27 NOTE — Progress Notes (Signed)
Pt transported on vent to ICU w/ no apparent complications.  RT given report.

## 2014-03-27 NOTE — Progress Notes (Signed)
eLink Physician-Brief Progress Note Patient Name: Amanda CharonMartha J Yang DOB: 1945/07/02 MRN: [id number]   Date of Service  03/27/2014  HPI/Events of Note  Respiratory failure Ischemic bowel? Surgery seeing Stable on vent  eICU Interventions  No intervention needed     Intervention Category Evaluation Type: New Patient Evaluation  Amanda Yang 03/27/2014, 4:40 PM

## 2014-03-27 NOTE — Progress Notes (Signed)
eLink Physician-Brief Progress Note Patient Name: Amanda CharonMartha J Yang DOB: 1945/11/28 MRN: [id number]   Date of Service  03/27/2014  HPI/Events of Note  Respiratory alkalosis  eICU Interventions  Decrease RR to 14, suspect baseline pCO2 60     Intervention Category Major Interventions: Respiratory failure - evaluation and management  MCQUAID, DOUGLAS 03/27/2014, 4:59 PM

## 2014-03-27 NOTE — Progress Notes (Signed)
Rec'd pt from [facility] hospital and placed pt on same vent settings.  Waiting on MD to eval and for vent orders.

## 2014-03-27 NOTE — ED Notes (Signed)
Patient sent from [facility] facility upstairs because CT scan showed ischemic bowel, chronic ventilator/ETT patient.  Patient responds to pain, 20 gauge left ac, central line right subclavian.  ETT in place, NG tube in place

## 2014-03-27 NOTE — Progress Notes (Signed)
[facility]                                                                                              Progress note     Patient Demographics  Amanda SievingMartha Yang, is a 68 y.o. female  RUE:[id number]  JYN:[id number]  DOB - 1945/10/24  Admit date - 03/27/2014  Admitting Physician No admitting provider for patient encounter.  Outpatient Primary MD for the patient is No primary provider on file.  LOS - 0   Chief Complaint  Patient presents with  . Ischemic bowel- CC consult      H&P and hospital course  Patient is a 68 year old female who was [facility]/2014 with respiratory failure and atrial fibrillation with rapid ventricular rate and was treated for right lower lobe aspiration pneumonia with Streptococcus B. with Unasyn . she had a septic-like picture at that time and improved she was placed on mechanical ventilation and failed extubation so was transferred to a hospital with ET tube. Weaning efforts were started however patient started having nausea and vomiting in the last 2-3 days. Her abdominal x-ray showed possible small bowel obstruction so a CT scan was done today and showed pneumatosis worrisome for devitalized small bowel and surgical consultation was recommended. Discussed with pulmonary critical care and surgery and the patient will be sent to the emergency room for evaluation by surgery. Patient has history of atrial fibrillation and is on digoxin and she is on treatment dose of Lovenox 100 mg subcutaneous twice a day.        Subjective:   Amanda Yang could not give any complaints due to intubation  Objective:   Vital signs  Temperature114 Heart rate 101 Respiratory rate 28 Blood pressure 114/68  Pulse ox 98%    Exam  obtunded  [place].AT,PERRAL ET tube in place  Supple Neck,No JVD, No cervical lymphadenopathy appriciated.  Symmetrical  Chest wall movement,  decreased respiratory sounds   and irregularly irregular ,No Gallops,Rubs or new Murmurs, No Parasternal Heave  decreased  B.Sounds, Abd Soft, No organomegaly appriciated, No rebound - guarding or rigidity. No Cyanosis, Clubbing or2 edema, No new Rash or bruise     I&Os minus 05/30/2006     OG tube  Foley - yes  ET tube        Data Review   CBC  Recent Labs Lab 03/22/14 0640 03/24/14 0500  WBC 15.5* 11.2*  HGB 11.4* 11.4*  HCT 35.7* 36.2  PLT 293 363  MCV 96.7 99.2  MCH 30.9 31.2  MCHC 31.9 31.5  RDW 14.6 14.8    Chemistries   Recent Labs Lab 03/22/14 0640 03/23/14 0600 03/24/14 0500 03/24/14 1200 03/26/14 0815 03/27/14 0500  NA 136* 139 141 142 142 140  K 3.5* 3.8 3.5* 3.7 3.2* 3.2*  CL 94* 95* 99 100 92* 89*  CO2 31 31 33* 33* 42* 43*  GLUCOSE 222* 246* 230* 204* 214* 262*  BUN 23 28* 24* 23 27* 27*  CREATININE 0.87 0.94 0.85 0.80 0.89 0.87  CALCIUM 8.9 8.9 8.8 8.6 8.8 8.5  MG  --  2.2  --  2.1 1.4* 1.8  AST 24  --   --  67*  --  95*  ALT 76*  --   --  93*  --  157*  ALKPHOS 84  --   --  83  --  77  BILITOT 0.9  --   --  0.9  --  0.8   ------------------------------------------------------------------------------------------------------------------ estimated creatinine clearance is 73.6 ml/min (by C-G formula based on Cr of 0.87). ------------------------------------------------------------------------------------------------------------------ No results found for this basename: HGBA1C,  in the last 72 hours ------------------------------------------------------------------------------------------------------------------  Recent Labs  03/27/14 0500  TRIG 113   ------------------------------------------------------------------------------------------------------------------ No results found for this basename: TSH, T4TOTAL, FREET3, T3FREE, THYROIDAB,  in the last 72  hours ------------------------------------------------------------------------------------------------------------------ No results found for this basename: VITAMINB12, FOLATE, FERRITIN, TIBC, IRON, RETICCTPCT,  in the last 72 hours  Coagulation profile  Recent Labs Lab 03/27/14 0500  INR 1.18    No results found for this basename: DDIMER,  in the last 72 hours  Cardiac Enzymes No results found for this basename: CK, CKMB, TROPONINI, MYOGLOBIN,  in the last 168 hours ------------------------------------------------------------------------------------------------------------------ No components found with this basename: POCBNP,   Micro Results Recent Results (from the past 240 hour(s))  CULTURE, RESPIRATORY (NON-EXPECTORATED)     Status: None   Collection Time    03/22/14  1:49 PM      Result Value Ref Range Status   Specimen Description TRACHEAL ASPIRATE   Final   Special Requests NONE   Final   Gram Stain     Final   Value: FEW WBC PRESENT, PREDOMINANTLY PMN     RARE SQUAMOUS EPITHELIAL CELLS PRESENT     NO ORGANISMS SEEN     Performed at [facility]   Culture     Final   Value: FEW METHICILLIN RESISTANT STAPHYLOCOCCUS AUREUS     Note: RIFAMPIN AND GENTAMICIN SHOULD NOT BE USED AS SINGLE DRUGS FOR TREATMENT OF STAPH INFECTIONS. This organism DOES NOT demonstrate inducible Clindamycin resistance in vitro. CRITICAL RESULT CALLED TO, READ BACK BY AND VERIFIED WITH: S.ADAMS RN @820       10.27.15 BY OANA     Performed at [facility]   Report Status 03/25/2014 FINAL   Final   Organism ID, Bacteria METHICILLIN RESISTANT STAPHYLOCOCCUS AUREUS   Final  CLOSTRIDIUM DIFFICILE BY PCR     Status: None   Collection Time    03/26/14 10:11 AM      Result Value Ref Range Status   C difficile by pcr NEGATIVE  NEGATIVE Final       Assessment & Plan   Bowel perforation/ischemic bowel by CT of abdomen Active fibrillation with rapid ventricular rates on Lovenox and  digoxin Respiratory failure/ET tube with history of SIRS resolved Protein calorie malnutrition was on TPN Diabetes mellitus on Lantus and insulin sliding scale  Plan  Transfer to ER Discussed with PCCM Discussed with surgery for surgical evaluation Code Status:  Full  Family Communication:  Discussed with brother   Antibiotics  started on vancomycin and Zosyn and Flagyl   Time Spent in minutes  43 minutes   DVT Prophylaxis  Lovenox treatment dose    Carron CurieHijazi, [place]rak[place] M.D on 03/27/2014 at 1:35 PM

## 2014-03-27 NOTE — ED Notes (Signed)
Critical care at bedside  

## 2014-03-27 NOTE — Consult Note (Signed)
Amanda CharonMartha J Yang August 25, 1945  [id number].   Requesting MD: Dr. Carron CurieAli Hijazi Chief Complaint/Reason for Consult: pneumatosis of small bowel HPI: This is a 68 yo white female with [facility] admission [facility] with CAP and respiratory failure.  She was intubated there with SIRS, acute kidney injury, a fib, metabolic encephalopathy, etc.  She was [facility] 03[facility]-14 for weaning purposes.  All of this information is gathered from the chart.  There is no family present and the patient is obtunded on the ventilator.  Apparently, she was trying to wean within the last couple of days and developed some nausea and vomiting.  She was noted to have an ileus in [facility].  [facility] which revealed an ileus vs obstruction.  A CT scan was then ordered today.  I have no idea if the patient actually had abdominal pain or not prior to her CT scan or now even.  Her CT scan showed pneumatosis with some mesenteric air vs portal venous gas.  [facility].  ROS: Unable to obtain as the patient is obtunded on the ventilator.  Family History: Unable to obtain  Past Medical History  Diagnosis Date  . Hypertension   . A-fib   . Diabetes   . Morbid obesity   . Alopecia   . Transaminitis     Past Surgical History: Unable to obtain given obtunded state and ventilator dependence  Social History:  Unable to obtain   Allergies:  Allergies  Allergen Reactions  . Pollen Extract      (Not in a hospital admission)  Blood pressure 106/67, pulse 103, temperature 99.8 F (37.7 C), temperature source Axillary, resp. rate 19, height 5\' 4"  (1.626 m), weight 240 lb (108.863 kg), SpO2 95.00%. Physical Exam: General: ill-appearing white female who is laying in bed on the ventilator HEENT: head is normocephalic, atraumatic, with no hair secondary to alopecia.    Ears and nose without any masses or lesions, dried cerumen present in her  external right auditory canal.  Mouth is pink with ETT in place Heart: regular, rate, and rhythm.  Normal s1,s2. No obvious murmurs, gallops, or rubs noted.  Palpable radial and pedal pulses bilaterally Lungs: CTAB, no wheezes, rhonchi, or rales noted.  Respiratory effort nonlabored Abd: soft, unable to assess tenderness, obese, hypoactive BS, no masses, hernias, or organomegaly MS: all 4 extremities are symmetrical with no cyanosis or clubbing Skin: warm and dry with no masses, lesions, or rashes Psych: obtunded on ventilator, unable to perform psych eval    Results for orders placed during the hospital encounter of 03/27/14 (from the past 48 hour(s))  MAGNESIUM     Status: None   Collection Time    03/27/14  2:34 PM      Result Value Ref Range   Magnesium 1.8  1.5 - 2.5 mg/dL  PHOSPHORUS     Status: None   Collection Time    03/27/14  2:34 PM      Result Value Ref Range   Phosphorus 3.4  2.3 - 4.6 mg/dL  AMYLASE     Status: None   Collection Time    03/27/14  2:34 PM      Result Value Ref Range   Amylase 34  0 - 105 U/L  LIPASE, BLOOD     Status: None   Collection Time    03/27/14  2:34 PM      Result Value Ref Range  Lipase 46  11 - 59 U/L  LACTIC ACID, PLASMA     Status: None   Collection Time    03/27/14  2:34 PM      Result Value Ref Range   Lactic Acid, Venous 1.4  0.5 - 2.2 mmol/L  CBC     Status: Abnormal   Collection Time    03/27/14  2:34 PM      Result Value Ref Range   WBC 9.6  4.0 - 10.5 K/uL   RBC 3.53 (*) 3.87 - 5.11 MIL/uL   Hemoglobin 11.2 (*) 12.0 - 15.0 g/dL   HCT 91.435.4 (*) 78.236.0 - 95.646.0 %   MCV 100.3 (*) 78.0 - 100.0 fL   MCH 31.7  26.0 - 34.0 pg   MCHC 31.6  30.0 - 36.0 g/dL   RDW 21.314.7  08.611.5 - 57.815.5 %   Platelets 372  150 - 400 K/uL   Ct Abdomen Pelvis W Contrast  03/27/2014   CLINICAL DATA:  Evaluate small bowel obstruction  EXAM: CT ABDOMEN AND PELVIS WITH CONTRAST  TECHNIQUE: Multidetector CT imaging of the abdomen and pelvis was performed  using the standard protocol following bolus administration of intravenous contrast.  CONTRAST:  100mL OMNIPAQUE IOHEXOL 300 MG/ML  SOLN  COMPARISON:  None.  FINDINGS: Small bilateral pleural effusions right greater than left with dependent atelectasis.  Small bowel loops in the lower abdomen have an abnormal appearance. This is characterized by pneumatosis and extraluminal bowel gas in the adjacent mesenteric. This may represent free intraperitoneal gas or portal venous gas. See images 59 through 69.  There is slight stranding in the mesenteric. No abscess. No free-fluid. Proximal bowel loops are mildly distended. No obvious cause of obstruction. No evidence of twisting of bowel loops. SMA is patent without obvious thrombus.  Gallstones without gallbladder wall thickening. Gallbladder wall calcification.  NG tube tip in the antrum.  Liver, spleen, pancreas, and right adrenal gland are within normal limits. Left adrenal gland is nodular without focal mass.  Calcified soft tissue area in the hilum of the spleen on image 30 measuring 2.3 cm may represent a small splenic artery aneurysm.  Multiple hypodensities in the right kidney are nonspecific. Chronic changes of the left kidney. No hydronephrosis.  Diverticulosis of the colon without evidence of diverticulitis.  There is stranding within the subcutaneous fat of both flanks worse on the left likely related to overall edema.  L5 superior endplate fracture versus large Schmorl's node. This does have a chronic appearance. Vacuum disc at L5-S1. Degenerative disc disease scattered throughout the lumbar spine.  Foley catheter decompresses the bladder. Uterus is absent. Right adnexa is unremarkable. 4.0 x 2.9 cm left adnexal partially calcified soft tissue area.  IMPRESSION: There is pneumatosis an adjacent extraluminal bowel gas as described worrisome for devitalized small bowel. Surgical consultation in is recommended. Critical Value/emergent results were called by  telephone at the time of interpretation on 03/27/2014 at 12:02 pm to Dr. Carron CurieALI HIJAZI , who verbally acknowledged these results.  Cholelithiasis.  Small bilateral pleural effusions and bibasilar atelectasis.  2.3 cm splenic artery aneurysm is not excluded. CT angiogram may better characterize.  Nonspecific right renal hypodensities.  4.0 cm left adnexal mass is nonspecific. This would be uncharacteristic as an ovary based on the patient's age. Ultrasound may be helpful.   Electronically Signed   By: Maryclare BeanArt  Hoss M.D.   On: 03/27/2014 12:03   Dg Chest Port 1 View  03/27/2014   CLINICAL DATA:  Respiratory  failure  EXAM: PORTABLE CHEST - 1 VIEW  COMPARISON:  03/26/2014  FINDINGS: Endotracheal tube in good position. Right jugular catheter tip in the SVC. NG tube enters the stomach.  Improved aeration. Decrease in bibasilar atelectasis. Negative for edema or effusion.  IMPRESSION: Support lines remain in good position.  Improvement in bibasilar atelectasis.   Electronically Signed   By: Marlan Palau M.D.   On: 03/27/2014 08:14   Dg Chest Port 1 View  03/26/2014   CLINICAL DATA:  Respirator weaning, shortness of breath.  EXAM: PORTABLE CHEST - 1 VIEW  COMPARISON:  03/25/2014  FINDINGS: Endotracheal tube tip 2.3 cm proximal to the carina. Right IJ catheter tip projects over the proximal SVC. NG tube descends below the level of the image. Aortic tortuosity. Heart size upper normal to mildly enlarged. Mild interstitial and hazy right greater than left infrahilar airspace opacities. Small effusions not excluded. No pneumothorax. No interval osseous change.  IMPRESSION: Similar appearance to the support devices as above.  Mild infrahilar opacities may reflect atelectasis, infiltrate, and/or edema.   Electronically Signed   By: Jearld Lesch M.D.   On: 03/26/2014 04:12   Dg Abd Portable 1v  03/25/2014   CLINICAL DATA:  68 year old female with vomiting and abdominal distension 1 week ago. Evaluate nasogastric tube  position. Initial encounter.  EXAM: PORTABLE ABDOMEN - 1 VIEW  COMPARISON:  03/23/2014.  FINDINGS: Nasogastric tube tip directed inferiorly at the expected level of the second portion of the duodenum.  Abnormal bowel gas pattern with gas distended small bowel loops with thickened folds. There is gas with an slightly prominent size colon. Findings may reflect underlying ileus although obstruction cannot be excluded. The degree of bowel wall thickening appears more prominent than on the prior exam.  The possibility of free intraperitoneal air cannot be assessed on a supine view.  IMPRESSION: Feeding tube tip proximal duodenal level.  Progressive abnormal bowel gas pattern as noted above.   Electronically Signed   By: Bridgett Larsson M.D.   On: 03/25/2014 15:38       Assessment/Plan 1. Pneumatosis of small bowel, on CT scan 2. VDRF 3. Possible AMS, do not know baseline 4. OSA 5. A fib 6. DM 7. Morbid obesity 8. Metabolic encephalopathy, apparently has some baseline cognitive impairment as well 9. H/o recent ileus 10. Alopecia 11. Elevated troponins at [facility] thought to be secondary to a fib with RVR  Plan: 1. The patient currently has a normal WBC which has been normalizing over the last couple of days to [facility] today.  She is AF and HR is in the 90s.  She is obtunded currently, but does not grimace or have any reaction to palpation on her abdomen.  We will not plan on emergent surgical intervention and perform serial abdominal exams and follow her vitals.  It is possible that her gut is still viable and she does not need an operation.  We agree with admission for close observation for the next 24-48 hrs.  If the patient were to acutely worsen, she may require surgical exploration.   Oriyah Lamphear E 03/27/2014, 3:13 PM Pager: [phone]

## 2014-03-28 ENCOUNTER — Inpatient Hospital Stay (HOSPITAL_COMMUNITY): Payer: Medicare Other

## 2014-03-28 DIAGNOSIS — J9620 Acute and chronic respiratory failure, unspecified whether with hypoxia or hypercapnia: Secondary | ICD-10-CM

## 2014-03-28 DIAGNOSIS — A419 Sepsis, unspecified organism: Secondary | ICD-10-CM

## 2014-03-28 DIAGNOSIS — R6520 Severe sepsis without septic shock: Secondary | ICD-10-CM

## 2014-03-28 LAB — CBC
HCT: 35.5 % — ABNORMAL LOW (ref 36.0–46.0)
Hemoglobin: 10.9 g/dL — ABNORMAL LOW (ref 12.0–15.0)
MCH: 30.3 pg (ref 26.0–34.0)
MCHC: 30.7 g/dL (ref 30.0–36.0)
MCV: 98.6 fL (ref 78.0–100.0)
Platelets: 414 10*3/uL — ABNORMAL HIGH (ref 150–400)
RBC: 3.6 MIL/uL — ABNORMAL LOW (ref 3.87–5.11)
RDW: 14.6 % (ref 11.5–15.5)
WBC: 9.1 10*3/uL (ref 4.0–10.5)

## 2014-03-28 LAB — GLUCOSE, CAPILLARY
Glucose-Capillary: 71 mg/dL (ref 70–99)
Glucose-Capillary: 98 mg/dL (ref 70–99)
Glucose-Capillary: 94 mg/dL (ref 70–99)
Glucose-Capillary: 115 mg/dL — ABNORMAL HIGH (ref 70–99)
Glucose-Capillary: 105 mg/dL — ABNORMAL HIGH (ref 70–99)
Glucose-Capillary: 128 mg/dL — ABNORMAL HIGH (ref 70–99)

## 2014-03-28 LAB — BASIC METABOLIC PANEL
ANION GAP: 9 (ref 5–15)
BUN: 23 mg/dL (ref 6–23)
CHLORIDE: 89 meq/L — AB (ref 96–112)
CO2: 41 meq/L — AB (ref 19–32)
Calcium: 8.3 mg/dL — ABNORMAL LOW (ref 8.4–10.5)
Creatinine, Ser: 1.04 mg/dL (ref 0.50–1.10)
GFR calc Af Amer: 62 mL/min — ABNORMAL LOW (ref >90)
GFR calc non Af Amer: 54 mL/min — ABNORMAL LOW (ref >90)
Glucose, Bld: 94 mg/dL (ref 70–99)
POTASSIUM: 3.2 meq/L — AB (ref 3.7–5.3)
SODIUM: 139 meq/L (ref 137–147)

## 2014-03-28 LAB — PHOSPHORUS: PHOSPHORUS: 3.1 mg/dL (ref 2.3–4.6)

## 2014-03-28 LAB — MAGNESIUM: Magnesium: 1.6 mg/dL (ref 1.5–2.5)

## 2014-03-28 MED ORDER — HEPARIN SODIUM (PORCINE) 5000 UNIT/ML IJ SOLN
5000.0000 [IU] | Freq: Three times a day (TID) | INTRAMUSCULAR | Status: DC
  Administered 2014-03-28 – 2014-04-02 (×16): 5000 [IU] via SUBCUTANEOUS
  Filled 2014-03-28 (×15): qty 1

## 2014-03-28 MED ORDER — KCL IN DEXTROSE-NACL 40-5-0.45 MEQ/L-%-% IV SOLN
INTRAVENOUS | Status: DC
  Administered 2014-03-28 – 2014-03-29 (×2): via INTRAVENOUS
  Administered 2014-03-30: 1000 mL via INTRAVENOUS
  Administered 2014-03-30 – 2014-04-01 (×3): via INTRAVENOUS
  Filled 2014-03-28 (×9): qty 1000

## 2014-03-28 NOTE — Progress Notes (Signed)
INITIAL NUTRITION ASSESSMENT  DOCUMENTATION CODES Per approved criteria  -Morbid Obesity   INTERVENTION:  Consider TPN if unable to start enteral nutrition within the next 7-8 days.  NUTRITION DIAGNOSIS: Inadequate oral intake related to inability to eat as evidenced by NPO status.   Goal: Nutrition support to provide 60-70% of estimated calorie needs (22-25 kcals/kg ideal body weight) and 100% of estimated protein needs, based on ASPEN guidelines for hypocaloric, high protein feeding in critically ill obese individuals  Monitor:  Weight trend, labs, vent status, ability to initiate nutrition support.  Reason for Assessment: VDRF  68 y.o. female  Admitting Dx: ? Necrotic Bowel  ASSESSMENT: 68 yo female initially admitted [facility] with ETT for vent wean/ ?trach in setting PNA, Afib RVR. Developed worsening AMS and n/v and CT abd concerning for necrotic bowel. [facility] for surgical eval/ admission.   Unable to complete nutrition focused physical exam at this time. Patient with ? necrotic bowel. Plans to continue full code status at this time per physician note. No surgery planned at this time per Surgery Team note.  Patient is currently intubated on ventilator support MV: 8.9 L/min Temp (24hrs), Avg:98.9 F (37.2 C), Min:98.4 F (36.9 C), Max:99.8 F (37.7 C)   Height: Ht Readings from Last 1 Encounters:  03/27/14 5\' 4"  (1.626 m)    Weight: Wt Readings from Last 1 Encounters:  03/28/14 236 lb 15.9 oz (107.5 kg)    Ideal Body Weight: 54.5 kg  % Ideal Body Weight: 197%  Wt Readings from Last 10 Encounters:  03/28/14 236 lb 15.9 oz (107.5 kg)    Usual Body Weight: unknown  % Usual Body Weight: n/a  BMI:  Body mass index is 40.66 kg/(m^2). class 3, extreme/morbid obesity  Estimated Nutritional Needs: Kcal: 1818 Hypocaloric feeding goal: 1200-1300 Protein: 109-136 gm Fluid: 2 L  Skin: unstageable pressure ulcer to sacrum  Diet Order:  NPO  EDUCATION NEEDS: -Education not appropriate at this time   Intake/Output Summary (Last 24 hours) at 03/28/14 1122 Last data filed at 03/28/14 0800  Gross per 24 hour  Intake 2010.5 ml  Output   1895 ml  Net  115.5 ml    Last BM: 10/29   Labs:   Recent Labs Lab 03/26/14 0815 03/27/14 0500 03/27/14 1434 03/28/14 0430  NA 142 140  --  139  K 3.2* 3.2*  --  3.2*  CL 92* 89*  --  89*  CO2 42* 43*  --  41*  BUN 27* 27*  --  23  CREATININE 0.89 0.87  --  1.04  CALCIUM 8.8 8.5  --  8.3*  MG 1.4* 1.8 1.8 1.6  PHOS 3.4 3.1 3.4 3.1  GLUCOSE 214* 262*  --  94    CBG (last 3)   Recent Labs  03/28/14 0038 03/28/14 0419 03/28/14 0822  GLUCAP 71 98 94    Scheduled Meds: . antiseptic oral rinse  7 mL Mouth Rinse QID  . chlorhexidine  15 mL Mouth Rinse BID  . Chlorhexidine Gluconate Cloth  6 each Topical Q0600  . digoxin  0.25 mg Per Tube Daily  . heparin subcutaneous  5,000 Units Subcutaneous 3 times per day  . insulin aspart  0-20 Units Subcutaneous 6 times per day  . insulin glargine  5 Units Subcutaneous QHS  . ipratropium-albuterol  3 mL Nebulization Q6H WA  . mupirocin ointment  1 application Nasal BID  . pantoprazole (PROTONIX) IV  40 mg Intravenous  QHS  . piperacillin-tazobactam (ZOSYN)  IV  3.375 g Intravenous Q8H  . vancomycin  1,000 mg Intravenous Q12H    Continuous Infusions: . dextrose 5 % and 0.45 % NaCl with KCl 40 mEq/L 75 mL/hr at 03/28/14 1042    Past Medical History  Diagnosis Date  . Hypertension   . A-fib   . Diabetes   . Morbid obesity   . Alopecia   . Transaminitis     History reviewed. No pertinent past surgical history.   Joaquin CourtsKimberly Harris, RD, LDN, CNSC Pager [phone] After Hours Pager [phone]

## 2014-03-28 NOTE — Progress Notes (Signed)
Discussed patient's clinical status extensively with brother, Gabriel EaringCalvin Majewski, who is her sole HCPOA. For now he would like us to keep her full code with full medical/surgical management. He however remains realistic about expectations and would like to be reached should she deteriorate further.  [phone].  [facility], MD

## 2014-03-28 NOTE — Progress Notes (Signed)
PULMONARY / CRITICAL CARE MEDICINE  Name: Amanda Yang  MRN: [id number]  DOB: 12-Feb-1946   ADMISSION DATE: 03/27/2014  REFERRING MD : Admit from [facility]   INITIAL PRESENTATION:  68yo female initially admitted to [facility] with ETT for vent wean/ ?trach in setting PNA, Afib RVR. Developed worsening AMS and n/v and CT abd concerning for necrotic bowel. [facility] for [facility] eval/ admission.   STUDIES:  10/29 CT Ab/Pelvis: Free air from possible necrotic small bowel, cholilithiasis, small bilateral pleural effusions and bibasilar atelectasis   SIGNIFICANT EVENTS:  10/29 tx to [facility] from [facility]  10/29 CCS consult: No indication for immediate surgery. CCS to to continue to follow clinically   SUBJECTIVE:  Pt is alert this morning, attempting to answer yes/no questions, and gesturing with hands. Denies any pain, shakes head no when asked.  No acute events noted over night, only adjustments to vent settings for respiratory alkalosis.   VITAL SIGNS:  Temp: [98.4 F (36.9 C)-99.8 F (37.7 C)] 98.4 F (36.9 C) (10/30 0823)  Pulse Rate: [72-105] 98 (10/30 1125)  Resp: [0-21] 20 (10/30 1125)  BP: (87-131)/(42-90) 122/90 mmHg (10/30 1125)  SpO2: [95 %-100 %] 95 % (10/30 1125)  FiO2 (%): [35 %-40 %] 40 % (10/30 1125)  Weight: [236 lb 15.9 oz (107.5 kg)-240 lb (108.863 kg)] 236 lb 15.9 oz (107.5 kg) (10/30 0500)  HEMODYNAMICS:   VENTILATOR SETTINGS:  Vent Mode: [-] PRVC  FiO2 (%): [35 %-40 %] 40 %  Set Rate: [14 bmp-16 bmp] 14 bmp  Vt Set: [450 mL] 450 mL  PEEP: [5 cmH20] 5 cmH20  Plateau Pressure: [20 cmH20-27 cmH20] 26 cmH20  INTAKE / OUTPUT:   Intake/Output Summary (Last 24 hours) at 03/28/14 1141 Last data filed at 03/28/14 0800   Gross per 24 hour   Intake  2010.5 ml   Output  1895 ml   Net  115.5 ml    PHYSICAL EXAMINATION:  General: Intubated, sedated, NAD Neuro: diffusely weak, no focal deficits, cognition appears intact HEENT: WNL Lungs: Diminished  throughout Abdomen: Obese, mildly distended, absent BS, non tender or minimally tender, no rebound or guarding  Ext: BUE edema, no LE edema   LABS:  CBC   Recent Labs  Lab  03/27/14 1300  03/27/14 1434  03/28/14 0430   WBC  9.1  9.6  9.1   HGB  10.1*  11.2*  10.9*   HCT  33.3*  35.4*  35.5*   PLT  389  372  414*    Coag's   Recent Labs  Lab  03/27/14 0500   APTT  40*   INR  1.18    BMET   Recent Labs  Lab  03/26/14 0815  03/27/14 0500  03/28/14 0430   NA  142  140  139   K  3.2*  3.2*  3.2*   CL  92*  89*  89*   CO2  42*  43*  41*   BUN  27*  27*  23   CREATININE  0.89  0.87  1.04   GLUCOSE  214*  262*  94    Electrolytes   Recent Labs  Lab  03/26/14 0815  03/27/14 0500  03/27/14 1434  03/28/14 0430   CALCIUM  8.8  8.5  --  8.3*   MG  1.4*  1.8  1.8  1.6   PHOS  3.4  3.1  3.4  3.1    Sepsis  Markers   Recent Labs  Lab  03/22/14 0640  03/27/14 1434   LATICACIDVEN  --  1.4   PROCALCITON  0.26  0.39    ABG   Recent Labs  Lab  03/21/14 1905  03/27/14 1643   PHART  7.393  7.521*   PCO2ART  49.9*  56.6*   PO2ART  86.4  64.0*    Liver Enzymes   Recent Labs  Lab  03/22/14 0640  03/24/14 1200  03/27/14 0500   AST  24  67*  95*   ALT  76*  93*  157*   ALKPHOS  84  83  77   BILITOT  0.9  0.9  0.8   ALBUMIN  2.0*  2.0*  2.0*    Cardiac Enzymes  No results found for this basename: TROPONINI, PROBNP, in the last 168 hours  Glucose   Recent Labs  Lab  03/27/14 1655  03/27/14 2038  03/28/14 0038  03/28/14 0419  03/28/14 0822   GLUCAP  151*  80  71  98  94    CXR: IS prominan  ASSESSMENT / PLAN:  PULMONARY  OETT 10/20 ([name])>>>  A:  Acute on chronic respiratory failure requiring prolonged ventilation  RLL PNA with Abx therapy completed  OSA with hypoventilation syndrome  P:  Cont full vent support - settings reviewed and/or adjusted Cont vent bundle Daily SBT if/when meets criteria Cont nebulized BDs  CARDIOVASCULAR    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  Hyponatremia - resolved  Hypokalemic and Hypochloremic - likely due to diarrhea and NGT  Hematuria - gross w/o UTI  P:  Monitor BMET intermittently Monitor I/Os Correct electrolytes as indicated  GASTROINTESTINAL  Abd pain, improving to resolved Ileus  ?Ischemic bowel - free air on CT abd  Elevated LFTs,  resolved  cholelithiasis with calcified gallbladder, w/o cholecytitis  P:  SUP: IV PPI Holding TFs CCS following  HEMATOLOGIC  Coagulopathy due to full dose lovenox  P:  DVT px: SQ heparin Monitor CBC intermittently Transfuse per usual ICU guidelines If no surgery planned, consider resumption of full anticoagulation   INFECTIOUS  A:  Concern for abd sepsis due to ischemic bowel Recent RLL PNA  MRSA colonization P:  BCx2 10/29>>>  Sputum 10/24 ([facility])>>> MRSA  Unasyn 10/15 >>>10/29  Vanc 10/29 >>>10/30  Zosyn 10/29 >>>  Flagyl 10/29 >>>  Consider d/c vanc -- low threshold with return to baseline neuro status, no fever, wbc ok, lactic acid wnl, and pct 0.39, s/p 14 day course unasyn  Con't zosyn (+/- vanc) and follow clinically   ENDOCRINE  DM  Risk of hypoglycemia with bowel rest  P:  Cont SSI, lantus   NEUROLOGIC  A:  Encephalopathy with multiple etiologies (metabolic +/- hepatic) - resolved to baseline, GCS 13  Baseline cognitive impairment  P:  RASS goal: -1  Lactulose on hold, ammonia wnl    FAMILY  - Updates: no family available 10/30, attempts have been made to contact brother in records. She is a poor candidate for laparotomy and for ACLS or trach/prolonged vent. Needs advanced directives/EOL discussion  Today's Summary:  35 mins CCM time Discussed with Dr Lindie Spruce If remains stable and no surgery planned,  can transfer back to [facility]  Needs Advanced directives/EOL discussion - might be worth getting Palliative Care involved [facility]   Billy Fischeravid Zackariah Vanderpol, MD ; [facility] service Mobile [phone].  After 5:30 PM or weekends, call [phone]

## 2014-03-28 NOTE — Progress Notes (Signed)
PULMONARY / CRITICAL CARE MEDICINE   Name: Amanda CharonMartha J Asa MRN: [id number] DOB: 12/05/1945    ADMISSION DATE:  03/27/2014  REFERRING MD :  Admit from [facility]   CHIEF COMPLAINT:  ?necrotic bowel  INITIAL PRESENTATION:  68yo female initially admitted to [facility] with ETT for vent wean/ ?trach in setting PNA, Afib RVR.  Developed worsening AMS and n/v and CT abd concerning for necrotic bowel.  [facility]    STUDIES:  10/29 CT Ab/Pelvis >>  Free air from possible necrotic small bowel, cholilithiasis, small bilateral pleural effusions and bibasilar atelectasis   SIGNIFICANT EVENTS: 10/29 tx to [facility] from [facility]    SUBJECTIVE:   Pt is alert  this morning, attempting to answer yes/no questions, and gesturing with hands.  Denies any pain, shakes head no when asked. No acute events noted over night, only adjustments to vent settings for respiratory alkalosis.  VITAL SIGNS: Temp:  [98.4 F (36.9 C)-99.8 F (37.7 C)] 98.4 F (36.9 C) (10/30 0823) Pulse Rate:  [72-105] 98 (10/30 1125) Resp:  [0-21] 20 (10/30 1125) BP: (87-131)/(42-90) 122/90 mmHg (10/30 1125) SpO2:  [95 %-100 %] 95 % (10/30 1125) FiO2 (%):  [35 %-40 %] 40 % (10/30 1125) Weight:  [236 lb 15.9 oz (107.5 kg)-240 lb (108.863 kg)] 236 lb 15.9 oz (107.5 kg) (10/30 0500) HEMODYNAMICS:   VENTILATOR SETTINGS: Vent Mode:  [-] PRVC FiO2 (%):  [35 %-40 %] 40 % Set Rate:  [14 bmp-16 bmp] 14 bmp Vt Set:  [450 mL] 450 mL PEEP:  [5 cmH20] 5 cmH20 Plateau Pressure:  [20 cmH20-27 cmH20] 26 cmH20 INTAKE / OUTPUT:  Intake/Output Summary (Last 24 hours) at 03/28/14 1141 Last data filed at 03/28/14 0800  Gross per 24 hour  Intake 2010.5 ml  Output   1895 ml  Net  115.5 ml    PHYSICAL EXAMINATION: General:  Ill-appearing obese female, appears considerably older than stated age, vented/sedated Neuro:  Pt answering y/n questions, following most commands, can move all extremities, no  grip strength in hands BL, cannot flex against resistance, GCS 13 HEENT:  Oral mucosa dry, lips cracked and peeling, no scleral icterus, crusty drainage in R ear. Cardiovascular:  RRR, no MGR Lungs:  Diminished bilaterally at the bases, crackles on the RLL with expiratory wheeze on right, rhonchi on the left  Abdomen:  Obese, non-distended, absent BS, ttp in RUQ, no rebound or guarding Muscleskeletal:  Moves all four extremities Skin:  Face pale, dry, pitting edema in BL UE 2+, LE 1+, hyperpigmentation to LE  LABS:  CBC  Recent Labs Lab 03/27/14 1300 03/27/14 1434 03/28/14 0430  WBC 9.1 9.6 9.1  HGB 10.1* 11.2* 10.9*  HCT 33.3* 35.4* 35.5*  PLT 389 372 414*   Coag's  Recent Labs Lab 03/27/14 0500  APTT 40*  INR 1.18   BMET  Recent Labs Lab 03/26/14 0815 03/27/14 0500 03/28/14 0430  NA 142 140 139  K 3.2* 3.2* 3.2*  CL 92* 89* 89*  CO2 42* 43* 41*  BUN 27* 27* 23  CREATININE 0.89 0.87 1.04  GLUCOSE 214* 262* 94   Electrolytes  Recent Labs Lab 03/26/14 0815 03/27/14 0500 03/27/14 1434 03/28/14 0430  CALCIUM 8.8 8.5  --  8.3*  MG 1.4* 1.8 1.8 1.6  PHOS 3.4 3.1 3.4 3.1   Sepsis Markers  Recent Labs Lab 03/22/14 0640 03/27/14 1434  LATICACIDVEN  --  1.4  PROCALCITON 0.26 0.39  ABG  Recent Labs Lab 03/21/14 1905 03/27/14 1643  PHART 7.393 7.521*  PCO2ART 49.9* 56.6*  PO2ART 86.4 64.0*   Liver Enzymes  Recent Labs Lab 03/22/14 0640 03/24/14 1200 03/27/14 0500  AST 24 67* 95*  ALT 76* 93* 157*  ALKPHOS 84 83 77  BILITOT 0.9 0.9 0.8  ALBUMIN 2.0* 2.0* 2.0*   Cardiac Enzymes No results found for this basename: TROPONINI, PROBNP,  in the last 168 hours Glucose  Recent Labs Lab 03/27/14 1655 03/27/14 2038 03/28/14 0038 03/28/14 0419 03/28/14 0822  GLUCAP 151* 80 71 98 94    Imaging Ct Abdomen Pelvis W Contrast  03/27/2014   CLINICAL DATA:  Evaluate small bowel obstruction  EXAM: CT ABDOMEN AND PELVIS WITH CONTRAST   TECHNIQUE: Multidetector CT imaging of the abdomen and pelvis was performed using the standard protocol following bolus administration of intravenous contrast.  CONTRAST:  100mL OMNIPAQUE IOHEXOL 300 MG/ML  SOLN  COMPARISON:  None.  FINDINGS: Small bilateral pleural effusions right greater than left with dependent atelectasis.  Small bowel loops in the lower abdomen have an abnormal appearance. This is characterized by pneumatosis and extraluminal bowel gas in the adjacent mesenteric. This may represent free intraperitoneal gas or portal venous gas. See images 59 through 69.  There is slight stranding in the mesenteric. No abscess. No free-fluid. Proximal bowel loops are mildly distended. No obvious cause of obstruction. No evidence of twisting of bowel loops. SMA is patent without obvious thrombus.  Gallstones without gallbladder wall thickening. Gallbladder wall calcification.  NG tube tip in the antrum.  Liver, spleen, pancreas, and right adrenal gland are within normal limits. Left adrenal gland is nodular without focal mass.  Calcified soft tissue area in the hilum of the spleen on image 30 measuring 2.3 cm may represent a small splenic artery aneurysm.  Multiple hypodensities in the right kidney are nonspecific. Chronic changes of the left kidney. No hydronephrosis.  Diverticulosis of the colon without evidence of diverticulitis.  There is stranding within the subcutaneous fat of both flanks worse on the left likely related to overall edema.  L5 superior endplate fracture versus large Schmorl's node. This does have a chronic appearance. Vacuum disc at L5-S1. Degenerative disc disease scattered throughout the lumbar spine.  Foley catheter decompresses the bladder. Uterus is absent. Right adnexa is unremarkable. 4.0 x 2.9 cm left adnexal partially calcified soft tissue area.  IMPRESSION: There is pneumatosis an adjacent extraluminal bowel gas as described worrisome for devitalized small bowel. Surgical  consultation in is recommended. Critical Value/emergent results were called by telephone at the time of interpretation on 03/27/2014 at 12:02 pm to Dr. Carron CurieALI [facility]IJAZI , who verbally acknowledged these results.  Cholelithiasis.  Small bilateral pleural effusions and bibasilar atelectasis.  2.3 cm splenic artery aneurysm is not excluded. CT angiogram may better characterize.  Nonspecific right renal hypodensities.  4.0 cm left adnexal mass is nonspecific. This would be uncharacteristic as an ovary based on the patient's age. Ultrasound may be helpful.   Electronically Signed   By: Maryclare BeanArt  Hoss M.D.   On: 03/27/2014 12:03   Dg Chest Port 1 View  03/27/2014   CLINICAL DATA:  Respiratory failure  EXAM: PORTABLE CHEST - 1 VIEW  COMPARISON:  03/26/2014  FINDINGS: Endotracheal tube in good position. Right jugular catheter tip in the SVC. NG tube enters the stomach.  Improved aeration. Decrease in bibasilar atelectasis. Negative for edema or effusion.  IMPRESSION: Support lines remain in good position.  Improvement in bibasilar  atelectasis.   Electronically Signed   By: Marlan Palau M.D.   On: 03/27/2014 08:14   ASSESSMENT / PLAN:  PULMONARY OETT 10/20 ([name])>>> A:  Acute on chronic respiratory failure requiring prolonged ventilation RLL PNA with adequate Abx therapy completed OSA with hypoventilation syndrome P:   Fully vented  Serial CXR BD's - duonebs, PRN albuterol  Revisit plans for trach - with pt and contacting family for long term care goal outcomes  CARDIOVASCULAR CVL R IJ CVL ([name])>>> HTN AFib RVR - currently NSR, previously on eliquis outpt, treated with digoxin and lovenox in [facility] SIRS - resolved Soft BP P:  Cont digoxin Tele monitoring Hold B blocker, Ace for now with relatively soft BP, maintain MAP > 60, gentle fluids D5 1/2 NS with KCL 40 mEq/L @ 75 Heparin + SCD's  RENAL AKI - resolved.  Hyponatremia - resolved Hypokalemic and Hypochloremic - likely due to diarrhea  and NGT Hematuria - gross w/o UTI P:   Follow BMET, I/O Supplement KCl Consider renal workup -  Nephro consult ? consider poststreptococcal glomerulonephritis or other kidney disease etiologies, multiple hypodensities in R kidney noted on CT abd/pelvis with chronic changes in L kidney and no hydronephrosis  GASTROINTESTINAL Abd pain, n/v Ileus  ?Ischemic bowel - free air on CT abd  Hx of transaminitis - suspect secondary to hypoperfusion in setting SIRS/sepsis - resolved cholelithiasis with calcified gallbladder, w/o cholecytitis P:   Surgery following - will not take to surgery due to baseline mental status and clinical exam neg for abd pain Bowel rest, NPO- hold tube feeds, NG tube, follow NG output for decrease volumes to be able to do clamping trials PPI  HEMATOLOGIC Coagulopathy - in setting full dose lovenox P:  Heparin for now with SCD's, will transition back to lovenox or eliquis F/u cbc, consider checking coags   INFECTIOUS A:   ??Perforated bowel, necrotic bowel Previous RLL PNA MRSA P:   BCx2 10/29>>> Sputum 10/24 ([facility])>>> MRSA  Unasyn 10/15 >>>10/29 Vanc 10/29 >>>10/30 Zosyn 10/29 >>>  Flagyl 10/29 >>>  Consider d/c vanc -- low threshold with return to baseline neuro status, no fever, wbc ok, lactic acid wnl, and pct 0.39, s/p 14 day course unasyn  Con't zosyn (+/- vanc) and follow clinically   ENDOCRINE DM  Avoid hypoglycemia with bowel rest P:   SSI, lantus   NEUROLOGIC A:  Encephalopathy with multiple etiologies (metabolic +/- hepatic) - resolved to baseline, GCS 13 Baseline cognitive impairment  P:   RASS goal: -1 Lactulose on hold, ammonia wnl   FAMILY  - Updates: no family available 10/30, attempts have been made to contact brother in [place]  Today's Summary:  Pt appears back to baseline today.  Surgery will not take pt to OR.  Will follow abdomen for possible recurrent ileus.  Need to establish goals for long-term care.  Currently  full-code.  Will continue to attempt to contact family.  Possible transfer back to [facility] and question of plan for trach?    Danelle Berry PA-S2  Billy Fischer, MD ; [facility] service Mobile [phone].  After 5:30 PM or weekends, call [phone]

## 2014-03-28 NOTE — Progress Notes (Signed)
CCS/[facility] Progress Note    Subjective: Patient continues to deny abdominal pain.  Adamantly shakes her head "no" when asked about abdominal pain.    Objective: Vital signs in last 24 hours: Temp:  [98.4 F (36.9 C)-99.8 F (37.7 C)] 98.4 F (36.9 C) (10/30 0823) Pulse Rate:  [72-105] 96 (10/30 0739) Resp:  [0-21] 19 (10/30 0739) BP: (87-131)/(42-70) 87/57 mmHg (10/30 0739) SpO2:  [95 %-100 %] 96 % (10/30 0739) FiO2 (%):  [35 %-40 %] 40 % (10/30 0739) Weight:  [107.5 kg (236 lb 15.9 oz)-108.863 kg (240 lb)] 107.5 kg (236 lb 15.9 oz) (10/30 0500) Last BM Date: 03/27/14  Intake/Output from previous day: 10/29 0701 - 10/30 0700 In: 1935.5 [I.V.:1098; IV Piggyback:837.5] Out: [id number] [Urine:1095; Emesis/NG output:700] Intake/Output this shift:    General: No acute distress  Lungs: Clear.  Chronic failure  Abd: soft, hypoactive bowel sounds.  Completely nontender.    Extremities: Non changes  Neuro: Intact  Lab Results:  @LABLAST2 (wbc:2,hgb:2,hct:2,plt:2) BMET  Recent Labs  03/27/14 0500 03/28/14 0430  NA 140 139  K 3.2* 3.2*  CL 89* 89*  CO2 43* 41*  GLUCOSE 262* 94  BUN 27* 23  CREATININE 0.87 1.04  CALCIUM 8.5 8.3*   PT/INR  Recent Labs  03/27/14 0500  LABPROT 15.1  INR 1.18   ABG  Recent Labs  03/27/14 1643  PHART 7.521*  HCO3 46.3*    Studies/Results: Ct Abdomen Pelvis W Contrast  03/27/2014   CLINICAL DATA:  Evaluate small bowel obstruction  EXAM: CT ABDOMEN AND PELVIS WITH CONTRAST  TECHNIQUE: Multidetector CT imaging of the abdomen and pelvis was performed using the standard protocol following bolus administration of intravenous contrast.  CONTRAST:  100mL OMNIPAQUE IOHEXOL 300 MG/ML  SOLN  COMPARISON:  None.  FINDINGS: Small bilateral pleural effusions right greater than left with dependent atelectasis.  Small bowel loops in the lower abdomen have an abnormal appearance. This is characterized by pneumatosis and extraluminal bowel gas in the  adjacent mesenteric. This may represent free intraperitoneal gas or portal venous gas. See images 59 through 69.  There is slight stranding in the mesenteric. No abscess. No free-fluid. Proximal bowel loops are mildly distended. No obvious cause of obstruction. No evidence of twisting of bowel loops. SMA is patent without obvious thrombus.  Gallstones without gallbladder wall thickening. Gallbladder wall calcification.  NG tube tip in the antrum.  Liver, spleen, pancreas, and right adrenal gland are within normal limits. Left adrenal gland is nodular without focal mass.  Calcified soft tissue area in the hilum of the spleen on image 30 measuring 2.3 cm may represent a small splenic artery aneurysm.  Multiple hypodensities in the right kidney are nonspecific. Chronic changes of the left kidney. No hydronephrosis.  Diverticulosis of the colon without evidence of diverticulitis.  There is stranding within the subcutaneous fat of both flanks worse on the left likely related to overall edema.  L5 superior endplate fracture versus large Schmorl's node. This does have a chronic appearance. Vacuum disc at L5-S1. Degenerative disc disease scattered throughout the lumbar spine.  Foley catheter decompresses the bladder. Uterus is absent. Right adnexa is unremarkable. 4.0 x 2.9 cm left adnexal partially calcified soft tissue area.  IMPRESSION: There is pneumatosis an adjacent extraluminal bowel gas as described worrisome for devitalized small bowel. Surgical consultation in is recommended. Critical Value/emergent results were called by telephone at the time of interpretation on 03/27/2014 at 12:02 pm to Dr. Carron CurieALI HIJAZI , who verbally acknowledged these results.  Cholelithiasis.  Small bilateral pleural effusions and bibasilar atelectasis.  2.3 cm splenic artery aneurysm is not excluded. CT angiogram may better characterize.  Nonspecific right renal hypodensities.  4.0 cm left adnexal mass is nonspecific. This would be  uncharacteristic as an ovary based on the patient's age. Ultrasound may be helpful.   Electronically Signed   By: Maryclare BeanArt  Hoss M.D.   On: 03/27/2014 12:03   Dg Chest Port 1 View  03/28/2014   CLINICAL DATA:  Respiratory distress  EXAM: PORTABLE CHEST - 1 VIEW  COMPARISON:  03/27/2014  FINDINGS: Endotracheal tube tip 3.1 cm proximal to the carina. Right IJ catheter tip projects over the mid SVC. NG tube descends below the level of the image. Mildly prominent cardiac contour. Aortic atherosclerosis. Small left greater than right pleural effusion and associated airspace opacity. Mild right lung base airspace opacity. No pneumothorax. No interval osseous change.  IMPRESSION: Unchanged support devices as above.  Small left greater than right pleural effusions and associated airspace opacities; may reflect atelectasis, aspiration, or pneumonia.   Electronically Signed   By: Jearld LeschAndrew  DelGaizo M.D.   On: 03/28/2014 04:51   Dg Chest Port 1 View  03/27/2014   CLINICAL DATA:  Respiratory failure  EXAM: PORTABLE CHEST - 1 VIEW  COMPARISON:  03/26/2014  FINDINGS: Endotracheal tube in good position. Right jugular catheter tip in the SVC. NG tube enters the stomach.  Improved aeration. Decrease in bibasilar atelectasis. Negative for edema or effusion.  IMPRESSION: Support lines remain in good position.  Improvement in bibasilar atelectasis.   Electronically Signed   By: Marlan Palauharles  Clark M.D.   On: 03/27/2014 08:14    Anti-infectives: Anti-infectives   Start     Dose/Rate Route Frequency Ordered Stop   03/28/14 0300  vancomycin (VANCOCIN) IVPB 1000 mg/200 mL premix     1,000 mg 200 mL/hr over 60 Minutes Intravenous Every 12 hours 03/27/14 1429     03/27/14 2100  piperacillin-tazobactam (ZOSYN) IVPB 3.375 g     3.375 g 12.5 mL/hr over 240 Minutes Intravenous Every 8 hours 03/27/14 1429     03/27/14 1430  vancomycin (VANCOCIN) 2,000 mg in sodium chloride 0.9 % 500 mL IVPB     2,000 mg 250 mL/hr over 120 Minutes  Intravenous STAT 03/27/14 1428 03/27/14 1720   03/27/14 1430  piperacillin-tazobactam (ZOSYN) IVPB 3.375 g     3.375 g 100 mL/hr over 30 Minutes Intravenous  Once 03/27/14 1428 03/27/14 1912      Assessment/Plan: s/p  WBC normal, no abdominal pain, nothing to indicate worsening abdominal process. No surgery planned.  Would repeat WBC and lactic acid level.   LOS: 1 day   Marta LamasJames O. Gae BonWyatt, III, MD, FACS [phone]--pager [phone]--office [facility] 03/28/2014

## 2014-03-29 LAB — GLUCOSE, CAPILLARY
GLUCOSE-CAPILLARY: 119 mg/dL — AB (ref 70–99)
GLUCOSE-CAPILLARY: 121 mg/dL — AB (ref 70–99)
GLUCOSE-CAPILLARY: 123 mg/dL — AB (ref 70–99)
GLUCOSE-CAPILLARY: 148 mg/dL — AB (ref 70–99)
GLUCOSE-CAPILLARY: 164 mg/dL — AB (ref 70–99)
GLUCOSE-CAPILLARY: 98 mg/dL (ref 70–99)

## 2014-03-29 LAB — LACTIC ACID, PLASMA: LACTIC ACID, VENOUS: 1.1 mmol/L (ref 0.5–2.2)

## 2014-03-29 MED ORDER — POTASSIUM CHLORIDE 10 MEQ/50ML IV SOLN
10.0000 meq | INTRAVENOUS | Status: AC
  Administered 2014-03-29 (×4): 10 meq via INTRAVENOUS
  Filled 2014-03-29 (×2): qty 50

## 2014-03-29 NOTE — Progress Notes (Signed)
PULMONARY / CRITICAL CARE MEDICINE  Name: Amanda CharonMartha J Yang  MRN: [id number]  DOB: August 04, 1945   ADMISSION DATE: 03/27/2014  REFERRING MD : Admit from [facility]   INITIAL PRESENTATION:  68 y/o female initially admitted [facility] with ETT for vent wean / ?trach in setting PNA, Afib RVR. Developed worsening AMS and n/v and CT abd concerning for necrotic bowel. [facility] for [facility]/ admission.   STUDIES:  10/29 CT Ab/Pelvis > Free air from possible necrotic small bowel, cholilithiasis, small bilateral pleural effusions and bibasilar atelectasis   SIGNIFICANT EVENTS:  10/29  tx to [facility] from [facility]  10/29  CCS consult: No indication for immediate surgery. CCS to to continue to follow clinically 10/31  Weaning on 14/5, 40%, benign abd exam    SUBJECTIVE:   RT reports pt weaning on PSV 14/5 due to low volumes, tolerating well.  No acute distress  VITAL SIGNS:  Filed Vitals:   03/29/14 0700 03/29/14 0736 03/29/14 0800 03/29/14 0826  BP: 126/87  133/64   Pulse: 97  88   Temp:  98.6 F (37 C)    TempSrc:  Oral    Resp: 19  18   Height:      Weight:      SpO2: 100%  99% 100%   HEMODYNAMICS:   VENTILATOR SETTINGS:  Vent Mode:  [-] CPAP;PSV FiO2 (%):  [40 %] 40 % Set Rate:  [14 bmp] 14 bmp Vt Set:  [450 mL] 450 mL PEEP:  [5 cmH20] 5 cmH20 Pressure Support:  [12 cmH20] 12 cmH20 Plateau Pressure:  [21 cmH20-34 cmH20] 24 cmH20  INTAKE / OUTPUT:  I/O last 3 completed shifts: In: 3182.5 [I.V.:2432.5; IV Piggyback:750] Out: 2400 [Urine:1650; Emesis/NG output:750] Total I/O In: 75 [I.V.:75] Out: 100 [Urine:100]   PHYSICAL EXAMINATION:  General: chronically ill in NAD Neuro: diffusely weak, no focal deficits, cognition appears intact HEENT: OETT, mm pink/moist, NGT in place Lungs: Diminished throughout Abdomen: Obese, mildly distended, decreased BS, non tender or minimally tender, no rebound or guarding  Ext: BUE edema, no LE edema   LABS:   Recent  Labs Lab 03/27/14 1300 03/27/14 1434 03/28/14 0430  HGB 10.1* 11.2* 10.9*  HCT 33.3* 35.4* 35.5*  WBC 9.1 9.6 9.1  PLT 389 372 414*    Recent Labs Lab 03/24/14 0500 03/24/14 1200 03/26/14 0815 03/27/14 0500 03/27/14 1434 03/28/14 0430  NA 141 142 142 140  --  139  K 3.5* 3.7 3.2* 3.2*  --  3.2*  CL 99 100 92* 89*  --  89*  CO2 33* 33* 42* 43*  --  41*  GLUCOSE 230* 204* 214* 262*  --  94  BUN 24* 23 27* 27*  --  23  CREATININE 0.85 0.80 0.89 0.87  --  1.04  CALCIUM 8.8 8.6 8.8 8.5  --  8.3*  MG  --  2.1 1.4* 1.8 1.8 1.6  PHOS  --  2.7 3.4 3.1 3.4 3.1     CXR: 10/30  Small R>L effusions  ASSESSMENT / PLAN:  PULMONARY  OETT 10/20 ([name])>>>  A:  Acute on chronic respiratory failure requiring prolonged ventilation  RLL PNA - s/p Abx therapy completed  OSA with hypoventilation syndrome Pleural Effusions - small, bilateral R>L P:  Cont full vent support - settings reviewed and/or adjusted Daily SBT if/when meets criteria Cont nebulized BDs Trend CXR   CARDIOVASCULAR   CVL R IJ CVL ([name]) >>   H/O HTN  AFRVR > NSR, previously on eliquis outpt, treated with digoxin and lovenox in [facility]  Borderline hypotension - resolved.  P:  Cont digoxin  Tele monitoring  Holding B blocker, ACEI  DVT:  Heparin SQ   RENAL  AKI - resolved.  Hyponatremia - resolved  Hypokalemia and Hypochloremic - likely due to diarrhea and NGT  Hematuria - gross w/o UTI, resolving.  P:  Monitor BMET intermittently Monitor I/Os Correct electrolytes as indicated, 50 KCL IV 10/31  KCL in MIVF, see below   GASTROINTESTINAL  Abd pain - improving to resolved Ileus  ?Ischemic bowel - free air on CT abd but neg exam.   Elevated LFTs,  resolved  Cholelithiasis with calcified gallbladder, w/o cholecytitis  P:  SUP: IV PPI Holding TFs CCS following, no plan for immediate surgery.   Bowel rest, NGT  HEMATOLOGIC  Coagulopathy - in setting of full dose lovenox  P:  DVT px: SQ  heparin Monitor CBC intermittently Transfuse per usual ICU guidelines If no surgery planned, consider resumption of full anticoagulation   INFECTIOUS  A:  Concern for abd sepsis due to ischemic bowel Recent RLL PNA  MRSA colonization P:  BCx2 10/29>>>  Sputum 10/24 ([facility])>>> MRSA  Unasyn 10/15 >>>10/29  Vanc 10/29 >>>10/30   Zosyn 10/29 >>>  Flagyl 10/29 >>>   D/C Vanc -with return to baseline neuro status, no fever, wbc ok, lactic acid wnl, and pct 0.39, s/p 14 day course unasyn  Con't zosyn and follow clinically   ENDOCRINE  DM  Risk of hypoglycemia with bowel rest  P:  Cont SSI Consider d/c lantus 5 u while NPO D5 1/5 NS w 40 mEQ KCL @75    NEUROLOGIC  A:  Encephalopathy with multiple etiologies (metabolic +/- hepatic) - resolved to baseline Baseline cognitive impairment  P:  RASS goal: 0 Lactulose on hold, ammonia wnl    Family Updates: no family available 10/31, attempts have been made to contact brother in records. She is a poor candidate for laparotomy and for ACLS or trach/prolonged vent. Needs advanced directives/EOL discussion  Today's Summary:  68 y/o F admitted with concerns for free air on CT but subsequent negative exam / work up (no free air on CXR) and [facility]. Needs Advanced directives/EOL discussion - might be worth getting Palliative Care involved [facility]  [facility] mg[facility]    Canary BrimBrandi Ollis, NP-C [place] [facility] Pgr: [phone] or [phone]   Attending Note:  I have examined the patient, reviewed the notes, labs, studies. I have discussed the patient with B Ollis.  I agree with the data, assessment and plans as amended by me in the note above.  The patient is critically ill with multiple organ systems failure and requires high complexity decision making for assessment and support, frequent evaluation and titration of therapies, application of  advanced monitoring technologies and extensive interpretation of multiple databases. My critical care time is 31 minutes independent of procedures or care given by other providers.  Levy Pupaobert Byrum, MD, PhD 03/29/2014, 6:15 PM [place] [facility] [phone] or if no answer [phone]

## 2014-03-29 NOTE — Code Documentation (Deleted)
CODE BLUE NOTE  Patient Name: Amanda CharonMartha J Genson   MRN: [id number]   Date of Birth/ Sex: [birth year] , female      Admission Date: 03/27/2014  Attending Provider: Alyson ReedyWesam G Yacoub, MD  Primary Diagnosis: Acute respiratory failure with hypoxia [J96.01] Altered awareness, transient [R40.4] Other coma depth [R40.244]    Indication: Pt was in her usual state of health until this AM, when she was noted to be a change in sensorium with acute rhythm change - initial rhythm was bradycardia that progressed to wide complex rhythm without a pulse. Code blue was subsequently called. At the time of arrival on scene, ACLS protocol was underway.  Noted CVVHD filter clotting and pt admitted with bilateral pleural effusions and concern for pericarditis.     Technical Description:  - CPR performance duration:  Approximately 45  minutes  - Was defibrillation or cardioversion used? Yes   - Was external pacer placed? Pads placed, pacing not required.   - Was patient intubated pre/post CPR? Intubated prior to CPR event.    Medications Administered: Y = Yes; Blank = No Amiodarone    Atropine    Calcium  Y  Epinephrine  Y  Lidocaine    Magnesium    Norepinephrine  Y  Phenylephrine  Y  Sodium bicarbonate  Y  TNK 50 mg  Vasopressin  Y    Post CPR evaluation:  Final Status - Was patient successfully resuscitated ? No   Miscellaneous Information:  Time of death:  0939 AM     Family Notified? Yes.  Updated in full at bedside.  Family present for end of code process.         Dr. Delton CoombesByrum at bedside during event.  Pharmacy, Code Team and Nursing Staff.  See CPR record for details.    Canary BrimBrandi Freemon Binford, NP-C [place] [facility] Pgr: [phone] or [phone]   03/29/2014, 9:50 AM

## 2014-03-29 NOTE — Progress Notes (Signed)
CCS/Tige Me[facility] Progress Note    Subjective: Patient's abdominal examination still appears to be benign.  Asleep on the ventilator  Objective: Vital signs in last 24 hours: Temp:  [98.3 F (36.8 C)-99.5 F (37.5 C)] 98.6 F (37 C) (10/31 0736) Pulse Rate:  [91-131] 103 (10/31 0600) Resp:  [11-21] 16 (10/31 0600) BP: (88-143)/(43-118) 125/61 mmHg (10/31 0600) SpO2:  [85 %-100 %] 97 % (10/31 0600) FiO2 (%):  [40 %] 40 % (10/31 0600) Weight:  [107.3 kg (236 lb 8.9 oz)] 107.3 kg (236 lb 8.9 oz) (10/31 0500) Last BM Date: 03/27/14  Intake/Output from previous day: 10/30 0701 - 10/31 0700 In: 1985 [I.V.:1522.5; IV Piggyback:462.5] Out: 1015 [Urine:965; Emesis/NG output:50] Intake/Output this shift:    General: No acute distress  Lungs: Clear.  Sats are okay.  Not being weaned.  Abd: Distended.  Hypoactive bowel sounds.  Does not appear to be tender.  Not getting any tube feedings.  700cc NGT output yesterday.  Extremities: No changes  Neuro: Asleep on the ventilator.  Lab Results:  @LABLAST2 (wbc:2,hgb:2,hct:2,plt:2) BMET  Recent Labs  03/27/14 0500 03/28/14 0430  NA 140 139  K 3.2* 3.2*  CL 89* 89*  CO2 43* 41*  GLUCOSE 262* 94  BUN 27* 23  CREATININE 0.87 1.04  CALCIUM 8.5 8.3*   PT/INR  Recent Labs  03/27/14 0500  LABPROT 15.1  INR 1.18   ABG  Recent Labs  03/27/14 1643  PHART 7.521*  HCO3 46.3*    Studies/Results: Ct Abdomen Pelvis W Contrast  03/27/2014   CLINICAL DATA:  Evaluate small bowel obstruction  EXAM: CT ABDOMEN AND PELVIS WITH CONTRAST  TECHNIQUE: Multidetector CT imaging of the abdomen and pelvis was performed using the standard protocol following bolus administration of intravenous contrast.  CONTRAST:  100mL OMNIPAQUE IOHEXOL 300 MG/ML  SOLN  COMPARISON:  None.  FINDINGS: Small bilateral pleural effusions right greater than left with dependent atelectasis.  Small bowel loops in the lower abdomen have an abnormal appearance. This is  characterized by pneumatosis and extraluminal bowel gas in the adjacent mesenteric. This may represent free intraperitoneal gas or portal venous gas. See images 59 through 69.  There is slight stranding in the mesenteric. No abscess. No free-fluid. Proximal bowel loops are mildly distended. No obvious cause of obstruction. No evidence of twisting of bowel loops. SMA is patent without obvious thrombus.  Gallstones without gallbladder wall thickening. Gallbladder wall calcification.  NG tube tip in the antrum.  Liver, spleen, pancreas, and right adrenal gland are within normal limits. Left adrenal gland is nodular without focal mass.  Calcified soft tissue area in the hilum of the spleen on image 30 measuring 2.3 cm may represent a small splenic artery aneurysm.  Multiple hypodensities in the right kidney are nonspecific. Chronic changes of the left kidney. No hydronephrosis.  Diverticulosis of the colon without evidence of diverticulitis.  There is stranding within the subcutaneous fat of both flanks worse on the left likely related to overall edema.  L5 superior endplate fracture versus large Schmorl's node. This does have a chronic appearance. Vacuum disc at L5-S1. Degenerative disc disease scattered throughout the lumbar spine.  Foley catheter decompresses the bladder. Uterus is absent. Right adnexa is unremarkable. 4.0 x 2.9 cm left adnexal partially calcified soft tissue area.  IMPRESSION: There is pneumatosis an adjacent extraluminal bowel gas as described worrisome for devitalized small bowel. Surgical consultation in is recommended. Critical Value/emergent results were called by telephone at the time of interpretation on 03/27/2014 at  12:02 pm to Dr. Carron CurieALI HIJAZI , who verbally acknowledged these results.  Cholelithiasis.  Small bilateral pleural effusions and bibasilar atelectasis.  2.3 cm splenic artery aneurysm is not excluded. CT angiogram may better characterize.  Nonspecific right renal hypodensities.   4.0 cm left adnexal mass is nonspecific. This would be uncharacteristic as an ovary based on the patient's age. Ultrasound may be helpful.   Electronically Signed   By: Maryclare BeanArt  Hoss M.D.   On: 03/27/2014 12:03   Dg Chest Port 1 View  03/28/2014   CLINICAL DATA:  Respiratory distress  EXAM: PORTABLE CHEST - 1 VIEW  COMPARISON:  03/27/2014  FINDINGS: Endotracheal tube tip 3.1 cm proximal to the carina. Right IJ catheter tip projects over the mid SVC. NG tube descends below the level of the image. Mildly prominent cardiac contour. Aortic atherosclerosis. Small left greater than right pleural effusion and associated airspace opacity. Mild right lung base airspace opacity. No pneumothorax. No interval osseous change.  IMPRESSION: Unchanged support devices as above.  Small left greater than right pleural effusions and associated airspace opacities; may reflect atelectasis, aspiration, or pneumonia.   Electronically Signed   By: Jearld LeschAndrew  DelGaizo M.D.   On: 03/28/2014 04:51    Anti-infectives: Anti-infectives   Start     Dose/Rate Route Frequency Ordered Stop   03/28/14 0300  vancomycin (VANCOCIN) IVPB 1000 mg/200 mL premix     1,000 mg 200 mL/hr over 60 Minutes Intravenous Every 12 hours 03/27/14 1429     03/27/14 2100  piperacillin-tazobactam (ZOSYN) IVPB 3.375 g     3.375 g 12.5 mL/hr over 240 Minutes Intravenous Every 8 hours 03/27/14 1429     03/27/14 1430  vancomycin (VANCOCIN) 2,000 mg in sodium chloride 0.9 % 500 mL IVPB     2,000 mg 250 mL/hr over 120 Minutes Intravenous STAT 03/27/14 1428 03/27/14 1720   03/27/14 1430  piperacillin-tazobactam (ZOSYN) IVPB 3.375 g     3.375 g 100 mL/hr over 30 Minutes Intravenous  Once 03/27/14 1428 03/27/14 1912      Assessment/Plan: s/p  Lactic acid level and WBC continue to be normal in the face of an equivocally negative examination.  When the patient is awakened she nods that she has no abdominal pain.  No free air seen on CXR. We will continue to  follow  LOS: 2 days   Marta LamasJames O. Gae BonWyatt, III, MD, FACS [phone]--pager [phone]--office [facility] 03/29/2014

## 2014-03-30 ENCOUNTER — Inpatient Hospital Stay (HOSPITAL_COMMUNITY): Payer: Medicare Other

## 2014-03-30 DIAGNOSIS — J9600 Acute respiratory failure, unspecified whether with hypoxia or hypercapnia: Secondary | ICD-10-CM

## 2014-03-30 DIAGNOSIS — R4182 Altered mental status, unspecified: Secondary | ICD-10-CM

## 2014-03-30 LAB — BASIC METABOLIC PANEL
ANION GAP: 9 (ref 5–15)
BUN: 9 mg/dL (ref 6–23)
CALCIUM: 8.3 mg/dL — AB (ref 8.4–10.5)
CO2: 32 mEq/L (ref 19–32)
CREATININE: 0.99 mg/dL (ref 0.50–1.10)
Chloride: 99 mEq/L (ref 96–112)
GFR calc non Af Amer: 57 mL/min — ABNORMAL LOW (ref >90)
GFR, EST AFRICAN AMERICAN: 66 mL/min — AB (ref >90)
Glucose, Bld: 144 mg/dL — ABNORMAL HIGH (ref 70–99)
Potassium: 4.3 mEq/L (ref 3.7–5.3)
Sodium: 140 mEq/L (ref 137–147)

## 2014-03-30 LAB — CBC
HCT: 33.6 % — ABNORMAL LOW (ref 36.0–46.0)
Hemoglobin: 10.5 g/dL — ABNORMAL LOW (ref 12.0–15.0)
MCH: 31 pg (ref 26.0–34.0)
MCHC: 31.3 g/dL (ref 30.0–36.0)
MCV: 99.1 fL (ref 78.0–100.0)
Platelets: 488 10*3/uL — ABNORMAL HIGH (ref 150–400)
RBC: 3.39 MIL/uL — ABNORMAL LOW (ref 3.87–5.11)
RDW: 14.8 % (ref 11.5–15.5)
WBC: 8.9 10*3/uL (ref 4.0–10.5)

## 2014-03-30 LAB — GLUCOSE, CAPILLARY
GLUCOSE-CAPILLARY: 120 mg/dL — AB (ref 70–99)
GLUCOSE-CAPILLARY: 126 mg/dL — AB (ref 70–99)
Glucose-Capillary: 145 mg/dL — ABNORMAL HIGH (ref 70–99)
Glucose-Capillary: 101 mg/dL — ABNORMAL HIGH (ref 70–99)
Glucose-Capillary: 130 mg/dL — ABNORMAL HIGH (ref 70–99)
Glucose-Capillary: 102 mg/dL — ABNORMAL HIGH (ref 70–99)

## 2014-03-30 MED ORDER — FENTANYL CITRATE 0.05 MG/ML IJ SOLN
INTRAMUSCULAR | Status: AC
  Administered 2014-03-30: 100 ug via INTRAVENOUS
  Filled 2014-03-30: qty 2

## 2014-03-30 MED ORDER — FENTANYL CITRATE 0.05 MG/ML IJ SOLN
25.0000 ug | INTRAMUSCULAR | Status: DC | PRN
  Administered 2014-03-30 – 2014-04-01 (×6): 100 ug via INTRAVENOUS
  Filled 2014-03-30 (×5): qty 2

## 2014-03-30 NOTE — Progress Notes (Signed)
PULMONARY / CRITICAL CARE MEDICINE  Name: Noah CharonMartha J Kriz  MRN: [id number]  DOB: September 23, 1945   ADMISSION DATE: 03/27/2014  REFERRING MD : [facility] from [facility]   INITIAL PRESENTATION:  68 y/o female initially admitted [facility] with ETT for vent wean / ?trach in setting PNA, Afib RVR. Developed worsening AMS and n/v and CT abd concerning for necrotic bowel. [facility].   STUDIES:  10/29 CT Ab/Pelvis > Free air from possible necrotic small bowel, cholilithiasis, small bilateral pleural effusions and bibasilar atelectasis   SIGNIFICANT EVENTS:  10/29  [facility] to [facility] from [facility]  10/29  CCS consult: No indication for immediate surgery. CCS to to continue to follow clinically 10/31  Weaning on 14/5, 40%, benign abd exam  11/01  Weaning on 5/5, 40%   SUBJECTIVE:   Pt weaning on 5/5 40%, no distress.    VITAL SIGNS:  Filed Vitals:   03/30/14 0600 03/30/14 0700 03/30/14 0800 03/30/14 0850  BP: 109/50 130/57 126/56   Pulse: 99 102 66 94  Temp:   98.5 F (36.9 C)   TempSrc:   Oral   Resp:  21 27 26   Height:      Weight:      SpO2: 97% 100% 100% 99%   HEMODYNAMICS:   VENTILATOR SETTINGS:  Vent Mode:  [-] CPAP;PSV FiO2 (%):  [40 %] 40 % Set Rate:  [14 bmp] 14 bmp Vt Set:  [450 mL] 450 mL PEEP:  [5 cmH20] 5 cmH20 Pressure Support:  [5 cmH20-12 cmH20] 5 cmH20 Plateau Pressure:  [23 cmH20-24 cmH20] 24 cmH20  INTAKE / OUTPUT:  I/O last 3 completed shifts: In: 3162.5 [I.V.:2625; IV Piggyback:537.5] Out: 2425 [Urine:2375; Emesis/NG output:50] Total I/O In: 87.5 [I.V.:75; IV Piggyback:12.5] Out: 225 [Urine:225]   PHYSICAL EXAMINATION:  General: chronically ill in NAD Neuro: diffusely weak, no focal deficits, cognition appears intact HEENT: OETT, mm pink/moist, NGT in place Lungs: Diminished throughout Abdomen: Obese, mildly distended, decreased BS, non tender or minimally tender, no rebound or guarding  Ext: BUE edema, no LE  edema   LABS:   Recent Labs Lab 03/27/14 1434 03/28/14 0430 03/30/14 0431  HGB 11.2* 10.9* 10.5*  HCT 35.4* 35.5* 33.6*  WBC 9.6 9.1 8.9  PLT 372 414* 488*    Recent Labs Lab 03/24/14 1200 03/26/14 0815 03/27/14 0500 03/27/14 1434 03/28/14 0430 03/30/14 0431  NA 142 142 140  --  139 140  K 3.7 3.2* 3.2*  --  3.2* 4.3  CL 100 92* 89*  --  89* 99  CO2 33* 42* 43*  --  41* 32  GLUCOSE 204* 214* 262*  --  94 144*  BUN 23 27* 27*  --  23 9  CREATININE 0.80 0.89 0.87  --  1.04 0.99  CALCIUM 8.6 8.8 8.5  --  8.3* 8.3*  MG 2.1 1.4* 1.8 1.8 1.6  --   PHOS 2.7 3.4 3.1 3.4 3.1  --      CXR: 10/30  Small R>L effusions.  Unable to view 11/1 cxr due to system complications.   ASSESSMENT / PLAN:  PULMONARY  OETT 10/20 ([name])>>>  A:  Acute on chronic respiratory failure requiring prolonged ventilation  RLL PNA - s/p Abx therapy completed  OSA with hypoventilation syndrome Pleural Effusions - small, bilateral R>L P:  Cont full vent support - settings reviewed and/or adjusted Daily SBT / WUA Cont nebulized BDs Trend CXR  Re-eval 11/1 for  potential extubation.  Would need to clarify goals of care before extubation - one way vs trach  CARDIOVASCULAR   CVL R IJ CVL ([name]) >>   H/O HTN  AFRVR > NSR, previously on eliquis outpt, treated with digoxin and lovenox [facility]  Borderline hypotension - resolved.  P:  Cont digoxin  Tele monitoring  Holding B blocker, ACEI  DVT:  Heparin SQ   RENAL  AKI - resolved.  Hyponatremia - resolved  Hypokalemia and Hypochloremic - likely due to diarrhea and NGT  Hematuria - gross w/o UTI, resolving.  P:  Monitor BMET intermittently Monitor I/Os Correct electrolytes as indicated KCL in MIVF, see below   GASTROINTESTINAL  Abd pain - improving to resolved Ileus  ?Ischemic bowel - pneumatosis on CT abd but neg exam and no free air noted on CXR.   Elevated LFTs,  resolved  Cholelithiasis with calcified gallbladder, w/o  cholecytitis  P:  SUP: IV PPI Holding TFs CCS following, no plan for immediate surgery.   Bowel rest, NGT Consider restart TF 11/2  HEMATOLOGIC  Coagulopathy - in setting of full dose lovenox  P:  DVT px: SQ heparin Monitor CBC intermittently Transfuse per usual ICU guidelines If no surgery planned, consider resumption of full anticoagulation, 11/2  INFECTIOUS  A:  Concern for abd sepsis due to ischemic bowel Recent RLL PNA  MRSA colonization P:  BCx2 10/29>>>  Sputum 10/24 ([facility])>>> MRSA  Unasyn 10/15 >>>10/29  Vanc 10/29 >>>10/30   Zosyn 10/29 >>>   D/C Vanc -with return to baseline neuro status, no fever, wbc ok, lactic acid wnl, and pct 0.39, s/p 14 day course unasyn  Con't zosyn and follow clinically   ENDOCRINE  DM  Risk of hypoglycemia with bowel rest  P:  Cont SSI Consider d/c lantus 5 u while NPO, monitor CBG's closely D5 1/5 NS w 40 mEQ KCL @75    NEUROLOGIC  A:  Encephalopathy with multiple etiologies (metabolic +/- hepatic) - resolved to baseline Baseline cognitive impairment  P:  RASS goal: 0 Lactulose on hold, ammonia wnl    Family Updates: No family available 11/1, Last discussion with brother, Gabriel EaringCalvin Christians on 10/30 [phone]). She is a poor candidate for laparotomy and for ACLS or trach/prolonged vent. Needs advanced directives/EOL discussion  Today's Summary:  68 y/o F admitted with concerns for free air on CT but subsequent negative exam / work up (no free air on CXR) and CCS rec's if remains stable [facility]. Needs Advanced directives/EOL discussion - might be [facility]  [facility]    Canary BrimBrandi Ollis, NP-C [place] [facility] Pgr: [phone] or [phone]  Attending Note:  I have examined the patient, reviewed the notes, labs, studies. I have discussed the patient with B Ollis. I agree with the data, assessment and plans as  amended by me in the note above. The patient is critically ill with multiple organ systems failure and requires high complexity decision making for assessment and support, frequent evaluation and titration of therapies, application of advanced monitoring technologies and extensive interpretation of multiple databases. My critical care time is 35 minutes independent of procedures or care given by other providers  Levy Pupaobert Jillianne Gamino, MD, PhD 03/30/2014, 12:42 PM [place] [facility] [phone] or if no answer [phone]

## 2014-03-30 NOTE — Progress Notes (Signed)
ANTIBIOTIC CONSULT NOTE - FOLLOW UP  Pharmacy Consult for Zosyn Indication: abdominal sepsis d/t ischemic bowel  Allergies  Allergen Reactions  . Pollen Extract     Patient Measurements: Height: 5\' 4"  (162.6 cm) Weight: 244 lb (110.678 kg) IBW/kg (Calculated) : 54.7  Vital Signs: Temp: 98.5 F (36.9 C) (11/01 0800) Temp Source: Oral (11/01 0800) BP: 118/61 mmHg (11/01 1100) Pulse Rate: 101 (11/01 1100) Intake/Output from previous day: 10/31 0701 - 11/01 0700 In: 1800 [I.V.:1725; IV Piggyback:75] Out: 2010 [Urine:2010] Intake/Output from this shift: Total I/O In: 237.5 [I.V.:225; IV Piggyback:12.5] Out: 550 [Urine:550]  Labs:  Recent Labs  03/27/14 1434 03/28/14 0430 03/30/14 0431  WBC 9.6 9.1 8.9  HGB 11.2* 10.9* 10.5*  PLT 372 414* 488*  CREATININE  --  1.04 0.99   Estimated Creatinine Clearance: 65.3 mL/min (by C-G formula based on Cr of 0.99).  Assessment: 68yof continues on day #4 antibiotics for abdominal sepsis d/t ? perf, ischemic bowel. Antibiotics de-escalated yesterday to just zosyn. Renal function remains stable.  Unasyn PTA 10/15>>10/29 Zosyn 10/29>> Vanc 10/29>>10/31  10/29 Bld x2>>ngtd 10/24 TA - MRSA (colonization) 10/28 CDiff - NEG  Goal of Therapy:  Appropriate dosing  Plan:  1) Continue zosyn 3.375g IV q8 ( 4 hour infusion) 2) Continue to follow renal function, cultures, LOT  Fredrik RiggerMarkle, Amanda Yang 03/30/2014,11:53 AM

## 2014-03-30 NOTE — Progress Notes (Signed)
  Subjective: Patient intubated, but awake and able to respond to questions She indicates no abdominal pain  Objective: Vital signs in last 24 hours: Temp:  [98.1 F (36.7 C)-99.3 F (37.4 C)] 98.1 F (36.7 C) (11/01 0405) Pulse Rate:  [66-110] 66 (11/01 0800) Resp:  [13-31] 27 (11/01 0800) BP: (90-150)/(40-79) 126/56 mmHg (11/01 0800) SpO2:  [95 %-100 %] 100 % (11/01 0800) FiO2 (%):  [40 %] 40 % (11/01 0800) Weight:  [244 lb (110.678 kg)] 244 lb (110.678 kg) (11/01 0400) Last BM Date: 03/27/14  Intake/Output from previous day: 10/31 0701 - 11/01 0700 In: 1775 [I.V.:1725; IV Piggyback:50] Out: 2010 [Urine:2010] Intake/Output this shift:    GI: obese, non-tender; no palpable masses;  Hypoactive bowel sounds Lab Results:   Recent Labs  03/28/14 0430 03/30/14 0431  WBC 9.1 8.9  HGB 10.9* 10.5*  HCT 35.5* 33.6*  PLT 414* 488*   BMET  Recent Labs  03/28/14 0430 03/30/14 0431  NA 139 140  K 3.2* 4.3  CL 89* 99  CO2 41* 32  GLUCOSE 94 144*  BUN 23 9  CREATININE 1.04 0.99  CALCIUM 8.3* 8.3*   PT/INR No results for input(s): LABPROT, INR in the last 72 hours. ABG  Recent Labs  03/27/14 1643  PHART 7.521*  HCO3 46.3*    Studies/Results: No results found.  Anti-infectives: Anti-infectives    Start     Dose/Rate Route Frequency Ordered Stop   03/28/14 0300  vancomycin (VANCOCIN) IVPB 1000 mg/200 mL premix  Status:  Discontinued     1,000 mg200 mL/hr over 60 Minutes Intravenous Every 12 hours 03/27/14 1429 03/29/14 1141   03/27/14 2100  piperacillin-tazobactam (ZOSYN) IVPB 3.375 g     3.375 g12.5 mL/hr over 240 Minutes Intravenous Every 8 hours 03/27/14 1429     03/27/14 1430  vancomycin (VANCOCIN) 2,000 mg in sodium chloride 0.9 % 500 mL IVPB     2,000 mg250 mL/hr over 120 Minutes Intravenous STAT 03/27/14 1428 03/27/14 1720   03/27/14 1430  piperacillin-tazobactam (ZOSYN) IVPB 3.375 g     3.375 g100 mL/hr over 30 Minutes Intravenous  Once 03/27/14  1428 03/27/14 1912      Assessment/Plan: Abdomen benign WBC and electrolytes normal   Unable to correlate to CT findings, but she currently has no surgical indications.  LOS: 3 days    Amanda Yang K. 03/30/2014

## 2014-03-31 ENCOUNTER — Inpatient Hospital Stay (HOSPITAL_COMMUNITY): Payer: Medicare Other

## 2014-03-31 DIAGNOSIS — J9600 Acute respiratory failure, unspecified whether with hypoxia or hypercapnia: Secondary | ICD-10-CM

## 2014-03-31 DIAGNOSIS — Z789 Other specified health status: Secondary | ICD-10-CM

## 2014-03-31 LAB — GLUCOSE, CAPILLARY
GLUCOSE-CAPILLARY: 116 mg/dL — AB (ref 70–99)
GLUCOSE-CAPILLARY: 132 mg/dL — AB (ref 70–99)
Glucose-Capillary: 132 mg/dL — ABNORMAL HIGH (ref 70–99)
Glucose-Capillary: 120 mg/dL — ABNORMAL HIGH (ref 70–99)
Glucose-Capillary: 106 mg/dL — ABNORMAL HIGH (ref 70–99)
Glucose-Capillary: 139 mg/dL — ABNORMAL HIGH (ref 70–99)

## 2014-03-31 LAB — CBC
HCT: 34 % — ABNORMAL LOW (ref 36.0–46.0)
Hemoglobin: 10.4 g/dL — ABNORMAL LOW (ref 12.0–15.0)
MCH: 30.1 pg (ref 26.0–34.0)
MCHC: 30.6 g/dL (ref 30.0–36.0)
MCV: 98.6 fL (ref 78.0–100.0)
PLATELETS: 488 10*3/uL — AB (ref 150–400)
RBC: 3.45 MIL/uL — ABNORMAL LOW (ref 3.87–5.11)
RDW: 15 % (ref 11.5–15.5)
WBC: 10.1 10*3/uL (ref 4.0–10.5)

## 2014-03-31 LAB — BASIC METABOLIC PANEL
Anion gap: 8 (ref 5–15)
BUN: 7 mg/dL (ref 6–23)
CO2: 31 meq/L (ref 19–32)
Calcium: 8.5 mg/dL (ref 8.4–10.5)
Chloride: 102 mEq/L (ref 96–112)
Creatinine, Ser: 1.03 mg/dL (ref 0.50–1.10)
GFR calc Af Amer: 63 mL/min — ABNORMAL LOW (ref >90)
GFR, EST NON AFRICAN AMERICAN: 54 mL/min — AB (ref >90)
Glucose, Bld: 143 mg/dL — ABNORMAL HIGH (ref 70–99)
Potassium: 4.8 mEq/L (ref 3.7–5.3)
SODIUM: 141 meq/L (ref 137–147)

## 2014-03-31 LAB — MAGNESIUM: Magnesium: 1.8 mg/dL (ref 1.5–2.5)

## 2014-03-31 MED ORDER — SORBITOL 70 % SOLN
30.0000 mL | Freq: Every day | Status: DC | PRN
  Filled 2014-03-31 (×2): qty 30

## 2014-03-31 MED ORDER — MAGNESIUM SULFATE 2 GM/50ML IV SOLN
2.0000 g | Freq: Once | INTRAVENOUS | Status: AC
  Administered 2014-03-31: 2 g via INTRAVENOUS
  Filled 2014-03-31: qty 50

## 2014-03-31 MED ORDER — WHITE PETROLATUM GEL
Status: AC
  Administered 2014-03-31: 0.2
  Filled 2014-03-31: qty 5

## 2014-03-31 MED ORDER — PRO-STAT SUGAR FREE PO LIQD
30.0000 mL | Freq: Every day | ORAL | Status: DC
  Administered 2014-03-31 – 2014-04-01 (×2): 30 mL
  Filled 2014-03-31 (×4): qty 30

## 2014-03-31 MED ORDER — COLLAGENASE 250 UNIT/GM EX OINT
TOPICAL_OINTMENT | Freq: Every day | CUTANEOUS | Status: DC
  Administered 2014-04-01: 23:00:00 via TOPICAL
  Administered 2014-04-01: 1 via TOPICAL
  Filled 2014-03-31 (×2): qty 30

## 2014-03-31 MED ORDER — VITAL HIGH PROTEIN PO LIQD
1000.0000 mL | ORAL | Status: DC
  Administered 2014-03-31: 1000 mL
  Administered 2014-04-01: 06:00:00
  Filled 2014-03-31 (×5): qty 1000

## 2014-03-31 NOTE — Consult Note (Addendum)
WOC wound consult note Reason for Consult: Consult requested for skin folds and middle back.  Pt has several systemic factors which can impair healing, including high BMI and immobility.  Wound type:Skin folds to abd, lower groin, and bilat breasts are red and moist with partial thickness fissures, appearance consistent with intertrigo. Middle back with unstageable pressure ulcer; 6X1cm, 100% tightly adhered brown slough.  No odor, small amt tan drainage.  Difficult to reduce pressure to this location.   Pressure Ulcer POA: Yes Periwound: Intact skin surrounding Dressing procedure/placement/frequency: Pt is on a Sport low airloss bed to reduce pressure.  Interdry silver-impregnated fabric to skin folds to wick moisture away from skin and provide antimicrobial benefits to skin folds, this should be left in place for 5 days for optimal plan of care. Santyl ointment to chemically debride nonviable tissue to middle back wound. Please re-consult if further assistance is needed.  Thank-you,  Cammie Mcgeeawn Panagiota Perfetti MSN, RN, CWOCN, [place]setWCN-AP, CNS [phone]

## 2014-03-31 NOTE — Consult Note (Signed)
Patient Amanda Yang      DOB: Feb 16, 1946      JKK:[id number]     Consult Note from the Palliative Medicine Team at [facility] Requested by: Dr. Lamonte Sakai    PCP: No primary care provider on file. Reason for Consultation: [place]    Phone Number:None  Assessment of patients Current state: I met today with Ms. Myers's brother and sister-in-law, Cal and Edd Arbour, at bedside. I verified phone number with family on facesheet and this is the only number they may be reached. They tell me that she was caring well for herself at home living independently managing her atrial fibrillation on Eliquis and managing her diabetes with self administration of insulin. They tell me that she became weak and confused and went to the hospital in [place] where she was diagnosed with pneumonia and required mechanical ventilation. They say she was extubated x 1 and failed in [place] and was sent to [facility] after ~1.5 weeks. She has now been intubated ~3 weeks according to family. The question now is possible tracheostomy. 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 He tells me that she has a Living Will he helped her prepare that names him as HCPOA (he says that [facility] had a copy of this) and says that in a terminal/vegetative state she would not want heroic measures including IV fluid hydration BUT she is awake and communicating with [place] the best she can, laughing and smiling at jokes, and nodding head yes/no appropriately. Cal says that at this time he is open to tracheostomy if this is needed but does have concerns regarding her quality of life. Ideally he would like to see her extubated and would like to have this conversation with her. I will continue to follow and support them.    Goals of Care: 1.  Code Status: FULL   2. Scope of Treatment: Continue all available and offered medical interventions. Please continue to discuss with her brother while she is intubated.    4. Disposition: To be determined.     3. Symptom Management:   1. Pain: Fentanyl 25-100 mcg every 2 hours prn.  2. Bowel Regimen: Sorbitol daily prn.  3. Weakness: Continue medical management.   4. Psychosocial: Emotional support provided to patient and family at bedside.    Brief HPI: 68 yo female intubated at [facility] for pneumonia and sent to [facility] to continue weaning attempts. [facility] was [facility] from [facility] for surgical eval for possible ischemic bowel which surgery has consulted and doubtful for ischemic bowel and no role for surgery at this point. She was reportedly having nausea and vomiting with CT abd/pelvis showing free air 10/29. Further discussion required regarding extubation/tracheomstomy options. PMH of atrial fibrillation on Eliquis, diabetes, alopecia, obesity, transaminitis.    ROS: Unable to assess - intubated.     PMH:  Past Medical History  Diagnosis Date  . Hypertension   . A-fib   . Diabetes   . Morbid obesity   . Alopecia   . Transaminitis      ZJI:RCVELFY reviewed. No pertinent past surgical history. I have reviewed the [place] and SH and  If appropriate update it with new information. Allergies  Allergen Reactions  . Pollen Extract    Scheduled Meds: . antiseptic oral rinse  7 mL Mouth Rinse QID  . chlorhexidine  15 mL Mouth Rinse BID  . Chlorhexidine Gluconate Cloth  6 each Topical Q0600  . collagenase  Topical Daily  . digoxin  0.25 mg Per Tube Daily  . feeding supplement (PRO-STAT SUGAR FREE 64)  30 mL Per Tube Q1200  . feeding supplement (VITAL HIGH PROTEIN)  1,000 mL Per Tube Q24H  . heparin subcutaneous  5,000 Units Subcutaneous 3 times per day  . insulin aspart  0-20 Units Subcutaneous 6 times per day  . insulin glargine  5 Units Subcutaneous QHS  . ipratropium-albuterol  3 mL Nebulization Q6H WA  . mupirocin ointment  1 application Nasal BID  . pantoprazole (PROTONIX) IV  40 mg Intravenous QHS  . piperacillin-tazobactam (ZOSYN)  IV  3.375 g Intravenous  Q8H   Continuous Infusions: . dextrose 5 % and 0.45 % NaCl with KCl 40 mEq/L 50 mL/hr at 03/31/14 1103   PRN Meds:.sodium chloride, fentaNYL    BP 138/99 mmHg  Pulse 91  Temp(Src) 98.7 F (37.1 C) (Oral)  Resp 21  Ht 5' 4"  (1.626 m)  Wt 108.41 kg (239 lb)  BMI 41.00 kg/m2  SpO2 100%   PPS: 20%   Intake/Output Summary (Last 24 hours) at 03/31/14 1440 Last data filed at 03/31/14 1300  Gross per 24 hour  Intake 2031.25 ml  Output   3230 ml  Net -1198.75 ml   LBM: 10/31  Physical Exam:  General: NAD, intubated HEENT:  ETT with vent, no JVD, moist mucous membranes without exudate Chest: Diminished throughout, no labored breathing, symmetric on vent, tolerating pressure support  CVS: RRR, S1 S2 Abdomen: Soft, NT, distended Ext: MAE, generalized edema, warm to touch Neuro: Awake, alert, nods yes/no appropriately, follows commands  Labs: CBC    Component Value Date/Time   WBC 10.1 03/31/2014 0424   RBC 3.45* 03/31/2014 0424   HGB 10.4* 03/31/2014 0424   HCT 34.0* 03/31/2014 0424   PLT 488* 03/31/2014 0424   MCV 98.6 03/31/2014 0424   MCH 30.1 03/31/2014 0424   MCHC 30.6 03/31/2014 0424   RDW 15.0 03/31/2014 0424    BMET    Component Value Date/Time   NA 141 03/31/2014 0424   K 4.8 03/31/2014 0424   CL 102 03/31/2014 0424   CO2 31 03/31/2014 0424   GLUCOSE 143* 03/31/2014 0424   BUN 7 03/31/2014 0424   CREATININE 1.03 03/31/2014 0424   CALCIUM 8.5 03/31/2014 0424   GFRNONAA 54* 03/31/2014 0424   GFRAA 63* 03/31/2014 0424    CMP     Component Value Date/Time   NA 141 03/31/2014 0424   K 4.8 03/31/2014 0424   CL 102 03/31/2014 0424   CO2 31 03/31/2014 0424   GLUCOSE 143* 03/31/2014 0424   BUN 7 03/31/2014 0424   CREATININE 1.03 03/31/2014 0424   CALCIUM 8.5 03/31/2014 0424   PROT 6.1 03/27/2014 0500   ALBUMIN 2.0* 03/27/2014 0500   AST 95* 03/27/2014 0500   ALT 157* 03/27/2014 0500   ALKPHOS 77 03/27/2014 0500   BILITOT 0.8 03/27/2014 0500    GFRNONAA 54* 03/31/2014 0424   GFRAA 63* 03/31/2014 0424      Time In Time Out Total Time Spent with Patient Total Overall Time  1320 1440 23mn 878m    Greater than 50%  of this time was spent counseling and coordinating care related to the above assessment and plan.  AlVinie SillNP Palliative Medicine Team Pager # [phone]-F 8a-5p) Team Phone # [phone]/Weekends)

## 2014-03-31 NOTE — Progress Notes (Signed)
PULMONARY / CRITICAL CARE MEDICINE  Name: Amanda Yang  MRN: [id number]  DOB: Oct 17, 1945   ADMISSION DATE: 03/27/2014  REFERRING MD : Admit from [facility]   INITIAL PRESENTATION:  68 y/o female initially admitted to [facility] with ETT for vent wean / ?trach in setting PNA, Afib RVR. Developed worsening AMS and n/v and CT abd concerning for necrotic bowel. Pt tx 10/29 to [facility] for surgical eval/ admission.   STUDIES/EVENTS:  10/29 CT Ab/Pelvis > Free air from possible necrotic small bowel, cholilithiasis, small bilateral pleural effusions and bibasilar atelectasis  10/29  tx to [facility] from [facility]  10/29  CCS consult: No indication for immediate surgery. CCS to to continue to follow clinically 10/31  Weaning on 14/5, 40%, benign abd exam  03/30/14:  RT reports pt weaning on PSV 14/5 due to low volumes, tolerating well.  No acute distress    SUBJECTIVE/OVERNIGHT/INTERVAL HX 03/31/14:  Patient says she is hungry . CCS doubts ischemic bowel. Doing SBT  VITAL SIGNS:  Filed Vitals:   03/31/14 0809 03/31/14 0842 03/31/14 0900 03/31/14 1000  BP:  129/79 128/64 119/63  Pulse:  95 100 78  Temp: 98.4 F (36.9 C)     TempSrc: Oral     Resp:  20 21 19   Height:      Weight:      SpO2:   100% 100%   HEMODYNAMICS:   VENTILATOR SETTINGS:  Vent Mode:  [-] PSV;CPAP FiO2 (%):  [40 %] 40 % Set Rate:  [14 bmp] 14 bmp Vt Set:  [450 mL] 450 mL PEEP:  [5 cmH20] 5 cmH20 Pressure Support:  [5 cmH20-8 cmH20] 5 cmH20 Plateau Pressure:  [18 cmH20-23 cmH20] 23 cmH20  INTAKE / OUTPUT:  I/O last 3 completed shifts: In: 2952.5 [I.V.:2715; IV Piggyback:237.5] Out: 4680 [Urine:4030; Emesis/NG output:650] Total I/O In: 255 [I.V.:255] Out: 275 [Urine:275]   PHYSICAL EXAMINATION:  General: chronically ill in NAD Neuro: diffusely weak, no focal deficits, cognition appears intact HEENT: OETT, mm pink/moist, NGT in place Lungs: Diminished throughout Abdomen: Obese, mildly distended, decreased BS  but present, non tender or minimally tender, no rebound or guarding  Ext: BUE edema, no LE edema   LABS:   PULMONARY  Recent Labs Lab 03/27/14 1643  PHART 7.521*  PCO2ART 56.6*  PO2ART 64.0*  HCO3 46.3*  TCO2 48  O2SAT 93.0    CBC  Recent Labs Lab 03/28/14 0430 03/30/14 0431 03/31/14 0424  HGB 10.9* 10.5* 10.4*  HCT 35.5* 33.6* 34.0*  WBC 9.1 8.9 10.1  PLT 414* 488* 488*    COAGULATION  Recent Labs Lab 03/27/14 0500  INR 1.18    CARDIAC  No results for input(s): TROPONINI in the last 168 hours. No results for input(s): PROBNP in the last 168 hours.   CHEMISTRY  Recent Labs Lab 03/24/14 1200 03/26/14 0815 03/27/14 0500 03/27/14 1434 03/28/14 0430 03/30/14 0431 03/31/14 0424  NA 142 142 140  --  139 140 141  K 3.7 3.2* 3.2*  --  3.2* 4.3 4.8  CL 100 92* 89*  --  89* 99 102  CO2 33* 42* 43*  --  41* 32 31  GLUCOSE 204* 214* 262*  --  94 144* 143*  BUN 23 27* 27*  --  23 9 7   CREATININE 0.80 0.89 0.87  --  1.04 0.99 1.03  CALCIUM 8.6 8.8 8.5  --  8.3* 8.3* 8.5  MG 2.1 1.4* 1.8 1.8 1.6  --  1.8  PHOS 2.7 3.4 3.1 3.4 3.1  --   --    Estimated Creatinine Clearance: 62 mL/min (by C-G formula based on Cr of 1.03).   LIVER  Recent Labs Lab 03/24/14 1200 03/27/14 0500  AST 67* 95*  ALT 93* 157*  ALKPHOS 83 77  BILITOT 0.9 0.8  PROT 6.2 6.1  ALBUMIN 2.0* 2.0*  INR  --  1.18     INFECTIOUS  Recent Labs Lab 03/27/14 1434 03/29/14 0400  LATICACIDVEN 1.4 1.1  PROCALCITON 0.39  --      ENDOCRINE CBG (last 3)   Recent Labs  03/30/14 2016 03/31/14 03/31/14 0352  GLUCAP 102* 120* 132*         IMAGING x48h Dg Chest Port 1 View  03/31/2014   CLINICAL DATA:  Acute respiratory failure.  EXAM: PORTABLE CHEST - 1 VIEW  COMPARISON:  03/30/2014.  FINDINGS: Unchanged support tubes and apparatus. Cardiomegaly. No significant pulmonary edema. Slight LEFT base opacity is improved. No pneumothorax.  IMPRESSION: Cardiomegaly.  Improved  aeration.  Stable support apparatus.   Electronically Signed   By: Davonna BellingJohn  Curnes M.D.   On: 03/31/2014 07:22   Dg Chest Port 1 View  03/30/2014   CLINICAL DATA:  Pleural effusions. Respiratory failure. On ventilator.  EXAM: PORTABLE CHEST - 1 VIEW  COMPARISON:  03/28/2014  FINDINGS: Support lines and tubes in appropriate position. Patient is rotated to the left. Mild opacity in the lateral left lung base may be due to effusion, atelectasis, or infiltrate. Right lung appears clear. Heart size is stable.  IMPRESSION: Patient rotated to the left. Persistent opacity lateral left lung base may be due to effusion, atelectasis, or infiltrate.   Electronically Signed   By: Myles RosenthalJohn  Stahl M.D.   On: 03/30/2014 11:02       ASSESSMENT / PLAN:  PULMONARY  OETT 10/20 ([name])>>>  A:  Acute on chronic respiratory failure requiring prolonged ventilation  RLL PNA - s/p Abx therapy completed  OSA with hypoventilation syndrome Pleural Effusions - small, bilateral R>L   - doing SBT but too frail for extubation  P:  Cont full vent support - settings reviewed and/or adjusted Daily SBT if/when meets criteria Cont nebulized BDs Trend CXR   CARDIOVASCULAR   CVL R IJ CVL ([name]) >>   H/O HTN  AFRVR > NSR, previously on eliquis outpt, treated with digoxin and lovenox in [facility]  Borderline hypotension - resolved.     - maintaining bp/hr  P:  Cont digoxin  Tele monitoring  Holding B blocker, ACEI  DVT:  Heparin SQ  (decide on eliquis if belly issues resolve and tolerates tube feeds)  RENAL  AKI - resolved.  Hyponatremia - resolved  Hypokalemia and Hypochloremic - likely due to diarrhea and NGT  Hematuria - gross w/o UTI, resolving.    - mild low mag   P:  Replete mag Monitor BMET intermittently Monitor I/Os Correct electrolytes as indicated, 50 KCL IV 10/31  KCL in MIVF, see below   GASTROINTESTINAL  Abd pain - improving to resolved Ileus  ?Ischemic bowel - free air on CT abd but neg  exam.   Elevated LFTs,  resolved  Cholelithiasis with calcified gallbladder, w/o cholecytitis    - nil acute per CCS 03/31/14. BS +. Minimal NG returns + P:  startr tube feed challenge  SUP: IV PPI CCS following, no plan for immediate surgery.     HEMATOLOGIC  Coagulopathy - in setting of full dose lovenox  P:  DVT  px: SQ heparin Monitor CBC intermittently Transfuse per usual ICU guidelines If no surgery planned, consider resumption of full anticoagulation provided tolerates tube feeds withiout abd catastrophe  INFECTIOUS  A:  Concern for abd sepsis due to ischemic bowel Recent RLL PNA  MRSA colonization   - nil acute 03/31/14 P:  BCx2 10/29>>>  Sputum 10/24 ([facility])>>> MRSA  Unasyn 10/15 >>>10/29  Vanc 10/29 >>>10/30   Zosyn 10/29 >>>  Flagyl 10/29 >>>  110/30/15   Con't zosyn and follow clinically   ENDOCRINE  DM  Risk of hypoglycemia with bowel rest  P:  Cont SSI Consider d/c lantus 5 u while NPO D5 1/5 NS w 40 mEQ KCL @75    NEUROLOGIC  A:  Encephalopathy with multiple etiologies (metabolic +/- hepatic) - resolved to baseline Baseline cognitive impairment    - normal mental status 11/05/31/13  P:  RASS goal: 0 Lactulose on hold, ammonia wnl    Family Updates: She is a poor candidate for laparotomy and for ACLS or trach/prolonged vent. Needs advanced directives/EOL discussion. Family not reachable since 03/28/14 by PCCM team. brother, Gabriel EaringCalvin Sweeny on 10/30 [phone]. Pall care called.     Today's Summary:  Call pall care to decide goals of care - one way extubation v trach v code status etc.,[facility] case manager working on it    The patient is critically ill with multiple organ systems failure and requires high complexity decision making for assessment and support, frequent evaluation and titration of therapies, application of advanced monitoring technologies and extensive interpretation of multiple databases.   Critical Care Time  devoted to patient care services described in this note is  30  Minutes. This time reflects time of care of this signee Dr Kalman ShanMurali Harrington Jobe. This critical care time does not reflect procedure time, or teaching time or supervisory time of PA/NP/Med student/Med Resident etc but could involve care discussion time    Dr. Kalman ShanMurali Alyanna Stoermer, M.D., [facility] CenterF.C.C.P Pulmonary and Critical Care Medicine Staff Physician [place] [facility] [place] [facility] Pager: [phone], If no answer or between  15:00h - 7:00h: call [phone]  03/31/2014 11:02 AM          Dr. Kalman ShanMurali Jamis Kryder, M.D., F.C.C.P Pulmonary and Critical Care Medicine Staff Physician [place] [facility] [place] [facility] Pager: [phone], If no answer or between  15:00h - 7:00h: call [phone]  03/31/2014 11:02 AM

## 2014-03-31 NOTE — Progress Notes (Signed)
Subjective: On vent opens eyes   Objective: Vital signs in last 24 hours: Temp:  [97.5 F (36.4 C)-98.8 F (37.1 C)] 98.4 F (36.9 C) (11/02 0354) Pulse Rate:  [66-113] 90 (11/02 0700) Resp:  [14-43] 14 (11/02 0700) BP: (103-149)/(44-119) 134/57 mmHg (11/02 0700) SpO2:  [94 %-100 %] 97 % (11/02 0700) FiO2 (%):  [40 %] 40 % (11/02 0428) Weight:  [239 lb (108.41 kg)] 239 lb (108.41 kg) (11/02 0500) Last BM Date: 03/29/14  Intake/Output from previous day: 11/01 0701 - 11/02 0700 In: 2052.5 [I.V.:1890; IV Piggyback:162.5] Out: 3780 [Urine:3130; Emesis/NG output:650] Intake/Output this shift:    GI: soft NT ND no reboud guarding or mass  Lab Results:   Recent Labs  03/30/14 0431 03/31/14 0424  WBC 8.9 10.1  HGB 10.5* 10.4*  HCT 33.6* 34.0*  PLT 488* 488*   BMET  Recent Labs  03/30/14 0431 03/31/14 0424  NA 140 141  K 4.3 4.8  CL 99 102  CO2 32 31  GLUCOSE 144* 143*  BUN 9 7  CREATININE 0.99 1.03  CALCIUM 8.3* 8.5   PT/INR No results for input(s): LABPROT, INR in the last 72 hours. ABG No results for input(s): PHART, HCO3 in the last 72 hours.  Invalid input(s): PCO2, PO2  Studies/Results: Dg Chest Port 1 View  03/31/2014   CLINICAL DATA:  Acute respiratory failure.  EXAM: PORTABLE CHEST - 1 VIEW  COMPARISON:  03/30/2014.  FINDINGS: Unchanged support tubes and apparatus. Cardiomegaly. No significant pulmonary edema. Slight LEFT base opacity is improved. No pneumothorax.  IMPRESSION: Cardiomegaly.  Improved aeration.  Stable support apparatus.   Electronically Signed   By: Davonna BellingJohn  Curnes M.D.   On: 03/31/2014 07:22   Dg Chest Port 1 View  03/30/2014   CLINICAL DATA:  Pleural effusions. Respiratory failure. On ventilator.  EXAM: PORTABLE CHEST - 1 VIEW  COMPARISON:  03/28/2014  FINDINGS: Support lines and tubes in appropriate position. Patient is rotated to the left. Mild opacity in the lateral left lung base may be due to effusion, atelectasis, or  infiltrate. Right lung appears clear. Heart size is stable.  IMPRESSION: Patient rotated to the left. Persistent opacity lateral left lung base may be due to effusion, atelectasis, or infiltrate.   Electronically Signed   By: Myles RosenthalJohn  Stahl M.D.   On: 03/30/2014 11:02    Anti-infectives: Anti-infectives    Start     Dose/Rate Route Frequency Ordered Stop   03/28/14 0300  vancomycin (VANCOCIN) IVPB 1000 mg/200 mL premix  Status:  Discontinued     1,000 mg200 mL/hr over 60 Minutes Intravenous Every 12 hours 03/27/14 1429 03/29/14 1141   03/27/14 2100  piperacillin-tazobactam (ZOSYN) IVPB 3.375 g     3.375 g12.5 mL/hr over 240 Minutes Intravenous Every 8 hours 03/27/14 1429     03/27/14 1430  vancomycin (VANCOCIN) 2,000 mg in sodium chloride 0.9 % 500 mL IVPB     2,000 mg250 mL/hr over 120 Minutes Intravenous STAT 03/27/14 1428 03/30/14 0942   03/27/14 1430  piperacillin-tazobactam (ZOSYN) IVPB 3.375 g     3.375 g100 mL/hr over 30 Minutes Intravenous  Once 03/27/14 1428 03/27/14 1912      Assessment/Plan: Abdominal pain with benign exam   Patient Active Problem List   Diagnosis Date Noted  . Abdominal pain 03/27/2014  . Respiratory failure 03/27/2014  . Altered mental status 03/27/2014  Exam benign and on no pressors Doubt ischemia at this point No immediate need for surgery   LOS: 4  days    Jaycelyn Orrison A. 03/31/2014

## 2014-03-31 NOTE — Progress Notes (Addendum)
Full note to follow:  I met today with Amanda Yang's brother and sister-in-law, Amanda and Amanda Yang, at bedside. I verified phone number with family on facesheet and this is the only number they may be reached. They tell me that she was caring well for herself at home living independently managing her atrial fibrillation on Eliquis and managing her diabetes with self administration of insulin. They tell me that she became weak and confused and went to the hospital in [place] where she was diagnosed with pneumonia and required mechanical ventilation. They say she was extubated x 1 and failed in [place] and was sent to [facility] after ~1.5 weeks. She has now been intubated ~3 weeks according to family. The question now is possible tracheostomy. 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 He tells me that she has a Living Will he helped her prepare that names him as HCPOA (he says that [facility] had a copy of this) and says that in a terminal/vegetative state she would not want heroic measures including IV fluid hydration BUT she is awake and communicating with [place] the best she can, laughing and smiling at jokes, and nodding head yes/no appropriately. Amanda says that at this time he is open to tracheostomy if this is needed but does have concerns regarding her quality of life. Ideally he would like to see her extubated and would like to have this conversation with her. I will continue to follow and support them.   Vinie Sill, NP Palliative Medicine Team Pager # [phone] (M-F 8a-5p) Team Phone # [phone] (Nights/Weekends)

## 2014-03-31 NOTE — Progress Notes (Addendum)
NUTRITION FOLLOW UP  Intervention:    Initiate TF via OGT with Vital High Protein at 25 ml/h and Prostat 30 ml once daily on day 1; on day 2, increase to goal rate of 50 ml/h (1200 ml per day) to provide 1300 kcals (24 kcals/kg ideal weight), 120 gm protein, 1003 ml free water daily.  Nutrition Dx:   Inadequate oral intake related to inability to eat as evidenced by NPO status, ongoing.  Goal:   Nutrition support to provide 60-70% of estimated calorie needs (22-25 kcals/kg ideal body weight) and 100% of estimated protein needs, based on ASPEN guidelines for hypocaloric, high protein feeding in critically ill obese individuals, unmet.  Monitor:   TF tolerance/adequacy, weight trend, labs, vent status.  Assessment:   68 yo female initially admitted [facility] with ETT for vent wean/ ?trach in setting PNA, Afib RVR. Developed worsening AMS and n/v and CT abd concerning for necrotic bowel. Pt tx 10[facility] for surgical eval/ admission.   Surgery team following, suspect not ischemic bowel, no need for immediate surgery. Received MD Consult for TF initiation and management.  Patient is currently intubated on ventilator support MV: 8.2 L/min Temp (24hrs), Avg:98.3 F (36.8 C), Min:97.5 F (36.4 C), Max:98.8 F (37.1 C)  Propofol: none  Height: Ht Readings from Last 1 Encounters:  03/27/14 5\' 4"  (1.626 m)    Weight Status:   Wt Readings from Last 1 Encounters:  03/31/14 239 lb (108.41 kg)   03/28/14 236 lb 15.9 oz (107.5 kg)    Re-estimated needs:  Kcal: 1722 Protein: 109-136 gm Fluid: 2 L  Skin: unstageable pressure ulcer to sacrum  Diet Order: Diet NPO time specified   Intake/Output Summary (Last 24 hours) at 03/31/14 1123 Last data filed at 03/31/14 1000  Gross per 24 hour  Intake   1995 ml  Output   3355 ml  Net  -1360 ml    Last BM: 10/31   Labs:   Recent Labs Lab 03/27/14 0500 03/27/14 1434 03/28/14 0430 03/30/14 0431 03/31/14 0424  NA 140   --  139 140 141  K 3.2*  --  3.2* 4.3 4.8  CL 89*  --  89* 99 102  CO2 43*  --  41* 32 31  BUN 27*  --  23 9 7   CREATININE 0.87  --  1.04 0.99 1.03  CALCIUM 8.5  --  8.3* 8.3* 8.5  MG 1.8 1.8 1.6  --  1.8  PHOS 3.1 3.4 3.1  --   --   GLUCOSE 262*  --  94 144* 143*    CBG (last 3)   Recent Labs  03/30/14 2016 03/31/14 03/31/14 0352  GLUCAP 102* 120* 132*    Scheduled Meds: . antiseptic oral rinse  7 mL Mouth Rinse QID  . chlorhexidine  15 mL Mouth Rinse BID  . Chlorhexidine Gluconate Cloth  6 each Topical Q0600  . digoxin  0.25 mg Per Tube Daily  . heparin subcutaneous  5,000 Units Subcutaneous 3 times per day  . insulin aspart  0-20 Units Subcutaneous 6 times per day  . insulin glargine  5 Units Subcutaneous QHS  . ipratropium-albuterol  3 mL Nebulization Q6H WA  . magnesium sulfate 1 - 4 g bolus IVPB  2 g Intravenous Once  . mupirocin ointment  1 application Nasal BID  . pantoprazole (PROTONIX) IV  40 mg Intravenous QHS  . piperacillin-tazobactam (ZOSYN)  IV  3.375 g Intravenous Q8H  Continuous Infusions: . dextrose 5 % and 0.45 % NaCl with KCl 40 mEq/L 1,000 mL (03/30/14 2123)     Joaquin CourtsKimberly Sabella Traore, RD, LDN, CNSC Pager [phone] After Hours Pager [phone]

## 2014-04-01 ENCOUNTER — Inpatient Hospital Stay (HOSPITAL_COMMUNITY): Payer: Medicare Other

## 2014-04-01 LAB — BASIC METABOLIC PANEL
Anion gap: 11 (ref 5–15)
Anion gap: 11 (ref 5–15)
BUN: 9 mg/dL (ref 6–23)
BUN: 10 mg/dL (ref 6–23)
CALCIUM: 9.2 mg/dL (ref 8.4–10.5)
CO2: 28 mEq/L (ref 19–32)
CO2: 34 mEq/L — ABNORMAL HIGH (ref 19–32)
CREATININE: 0.99 mg/dL (ref 0.50–1.10)
Calcium: 8.6 mg/dL (ref 8.4–10.5)
Chloride: 100 mEq/L (ref 96–112)
Chloride: 99 mEq/L (ref 96–112)
Creatinine, Ser: 1.02 mg/dL (ref 0.50–1.10)
GFR calc Af Amer: 64 mL/min — ABNORMAL LOW (ref >90)
GFR calc non Af Amer: 55 mL/min — ABNORMAL LOW (ref >90)
GFR, EST AFRICAN AMERICAN: 66 mL/min — AB (ref >90)
GFR, EST NON AFRICAN AMERICAN: 57 mL/min — AB (ref >90)
GLUCOSE: 109 mg/dL — AB (ref 70–99)
Glucose, Bld: 208 mg/dL — ABNORMAL HIGH (ref 70–99)
Potassium: 4.5 mEq/L (ref 3.7–5.3)
Potassium: 4.1 mEq/L (ref 3.7–5.3)
Sodium: 139 mEq/L (ref 137–147)
Sodium: 144 mEq/L (ref 137–147)

## 2014-04-01 LAB — CBC WITH DIFFERENTIAL/PLATELET
BASOS ABS: 0.1 10*3/uL (ref 0.0–0.1)
BASOS PCT: 1 % (ref 0–1)
Eosinophils Absolute: 0.2 10*3/uL (ref 0.0–0.7)
Eosinophils Relative: 2 % (ref 0–5)
HCT: 34.9 % — ABNORMAL LOW (ref 36.0–46.0)
Hemoglobin: 11 g/dL — ABNORMAL LOW (ref 12.0–15.0)
Lymphocytes Relative: 6 % — ABNORMAL LOW (ref 12–46)
Lymphs Abs: 0.8 10*3/uL (ref 0.7–4.0)
MCH: 31 pg (ref 26.0–34.0)
MCHC: 31.5 g/dL (ref 30.0–36.0)
MCV: 98.3 fL (ref 78.0–100.0)
MONO ABS: 1.1 10*3/uL — AB (ref 0.1–1.0)
Monocytes Relative: 9 % (ref 3–12)
NEUTROS PCT: 82 % — AB (ref 43–77)
Neutro Abs: 9.9 10*3/uL — ABNORMAL HIGH (ref 1.7–7.7)
Platelets: 542 10*3/uL — ABNORMAL HIGH (ref 150–400)
RBC: 3.55 MIL/uL — ABNORMAL LOW (ref 3.87–5.11)
RDW: 14.8 % (ref 11.5–15.5)
WBC: 12.1 10*3/uL — ABNORMAL HIGH (ref 4.0–10.5)

## 2014-04-01 LAB — MAGNESIUM: MAGNESIUM: 2.1 mg/dL (ref 1.5–2.5)

## 2014-04-01 LAB — GLUCOSE, CAPILLARY
GLUCOSE-CAPILLARY: 135 mg/dL — AB (ref 70–99)
Glucose-Capillary: 176 mg/dL — ABNORMAL HIGH (ref 70–99)
Glucose-Capillary: 153 mg/dL — ABNORMAL HIGH (ref 70–99)
Glucose-Capillary: 178 mg/dL — ABNORMAL HIGH (ref 70–99)
Glucose-Capillary: 195 mg/dL — ABNORMAL HIGH (ref 70–99)
Glucose-Capillary: 134 mg/dL — ABNORMAL HIGH (ref 70–99)
Glucose-Capillary: 108 mg/dL — ABNORMAL HIGH (ref 70–99)

## 2014-04-01 LAB — PHOSPHORUS: PHOSPHORUS: 2.8 mg/dL (ref 2.3–4.6)

## 2014-04-01 LAB — PRO B NATRIURETIC PEPTIDE: Pro B Natriuretic peptide (BNP): 909.6 pg/mL — ABNORMAL HIGH (ref 0–125)

## 2014-04-01 MED ORDER — FUROSEMIDE 10 MG/ML IJ SOLN: 40.0000 mg | Freq: Two times a day (BID) | INTRAMUSCULAR | Status: DC

## 2014-04-01 MED ORDER — VANCOMYCIN HCL 10 G IV SOLR
1500.0000 mg | Freq: Once | INTRAVENOUS | Status: AC
  Administered 2014-04-01: 1500 mg via INTRAVENOUS
  Filled 2014-04-01: qty 1500

## 2014-04-01 MED ORDER — FUROSEMIDE 10 MG/ML IJ SOLN
40.0000 mg | INTRAMUSCULAR | Status: AC
  Administered 2014-04-01: 40 mg via INTRAVENOUS
  Filled 2014-04-01: qty 4

## 2014-04-01 MED ORDER — FUROSEMIDE 10 MG/ML IJ SOLN
40.0000 mg | Freq: Two times a day (BID) | INTRAMUSCULAR | Status: DC
  Administered 2014-04-01 – 2014-04-02 (×2): 40 mg via INTRAVENOUS
  Filled 2014-04-01 (×4): qty 4

## 2014-04-01 MED ORDER — MIDAZOLAM HCL 2 MG/2ML IJ SOLN
2.0000 mg | Freq: Once | INTRAMUSCULAR | Status: AC
  Administered 2014-04-01: 2 mg via INTRAVENOUS

## 2014-04-01 MED ORDER — DEXTROSE-NACL 5-0.45 % IV SOLN
INTRAVENOUS | Status: DC
  Administered 2014-04-01: 12:00:00 via INTRAVENOUS

## 2014-04-01 MED ORDER — POTASSIUM CHLORIDE 20 MEQ/15ML (10%) PO SOLN
40.0000 meq | Freq: Two times a day (BID) | ORAL | Status: DC
  Administered 2014-04-01: 40 meq
  Filled 2014-04-01 (×4): qty 30

## 2014-04-01 MED ORDER — VANCOMYCIN HCL IN DEXTROSE 1-5 GM/200ML-% IV SOLN
1000.0000 mg | Freq: Two times a day (BID) | INTRAVENOUS | Status: DC
  Administered 2014-04-02 (×2): 1000 mg via INTRAVENOUS
  Filled 2014-04-01 (×3): qty 200

## 2014-04-01 MED ORDER — MIDAZOLAM HCL 2 MG/2ML IJ SOLN
INTRAMUSCULAR | Status: AC
  Administered 2014-04-01: 2 mg via INTRAVENOUS
  Filled 2014-04-01: qty 2

## 2014-04-01 NOTE — Progress Notes (Signed)
Patient ID: Amanda Yang, female   DOB: 1946/03/04, 68 y.o.   MRN: [id number]    Subjective: Remains intubated, but shakes her head no to abdominal pain  Objective: Vital signs in last 24 hours: Temp:  [98.1 F (36.7 C)-99.4 F (37.4 C)] 98.5 F (36.9 C) (11/03 0403) Pulse Rate:  [78-122] 97 (11/03 0821) Resp:  [16-27] 22 (11/03 0821) BP: (119-171)/(59-99) 156/88 mmHg (11/03 0821) SpO2:  [90 %-100 %] 100 % (11/03 0821) FiO2 (%):  [40 %] 40 % (11/03 0821) Weight:  [230 lb (104.327 kg)] 230 lb (104.327 kg) (11/03 0500) Last BM Date: 03/29/14  Intake/Output from previous day: 11/02 0701 - 11/03 0700 In: 2081.3 [I.V.:1431.3; NG/GT:487.5; IV Piggyback:162.5] Out: 2410 [Urine:2210; Emesis/NG output:200] Intake/Output this shift:    PE: Abd: soft, NT, obese, +BS  Lab Results:   Recent Labs  03/31/14 0424 04/01/14 0439  WBC 10.1 12.1*  HGB 10.4* 11.0*  HCT 34.0* 34.9*  PLT 488* 542*   BMET  Recent Labs  03/31/14 0424 04/01/14 0439  NA 141 139  K 4.8 4.5  CL 102 100  CO2 31 28  GLUCOSE 143* 208*  BUN 7 9  CREATININE 1.03 0.99  CALCIUM 8.5 8.6   PT/INR No results for input(s): LABPROT, INR in the last 72 hours. CMP     Component Value Date/Time   NA 139 04/01/2014 0439   K 4.5 04/01/2014 0439   CL 100 04/01/2014 0439   CO2 28 04/01/2014 0439   GLUCOSE 208* 04/01/2014 0439   BUN 9 04/01/2014 0439   CREATININE 0.99 04/01/2014 0439   CALCIUM 8.6 04/01/2014 0439   PROT 6.1 03/27/2014 0500   ALBUMIN 2.0* 03/27/2014 0500   AST 95* 03/27/2014 0500   ALT 157* 03/27/2014 0500   ALKPHOS 77 03/27/2014 0500   BILITOT 0.8 03/27/2014 0500   GFRNONAA 57* 04/01/2014 0439   GFRAA 66* 04/01/2014 0439   Lipase     Component Value Date/Time   LIPASE 46 03/27/2014 1434       Studies/Results: Dg Chest Port 1 View  04/01/2014   CLINICAL DATA:  Acute respiratory failure ; history of diabetes, atrial fibrillation, and morbid obesity  EXAM: PORTABLE CHEST - 1 VIEW   COMPARISON:  Portable chest x-ray of March 31, 2014  FINDINGS: The lungs remain well-expanded. The interstitial markings have increased bilaterally. The left hemidiaphragm is less well demonstrated today. The cardiopericardial silhouette remains enlarged. The pulmonary vascularity is more engorged centrally today.  The endotracheal tube tip lies approximately 3.5 cm above the crotch of the carina. The esophagogastric tube tip projects below the inferior margin of the image. The right internal jugular venous catheter tip projects over the junction of the proximal and midportions of the SVC.  IMPRESSION: There has been interval deterioration in the appearance of the pulmonary interstitium and the lung bases consistent with interstitial edema and bibasilar atelectasis.   Electronically Signed   By: David  SwazilandJordan   On: 04/01/2014 07:37   Dg Chest Port 1 View  03/31/2014   CLINICAL DATA:  Acute respiratory failure.  EXAM: PORTABLE CHEST - 1 VIEW  COMPARISON:  03/30/2014.  FINDINGS: Unchanged support tubes and apparatus. Cardiomegaly. No significant pulmonary edema. Slight LEFT base opacity is improved. No pneumothorax.  IMPRESSION: Cardiomegaly.  Improved aeration.  Stable support apparatus.   Electronically Signed   By: Davonna BellingJohn  Curnes M.D.   On: 03/31/2014 07:22    Anti-infectives: Anti-infectives    Start     Dose/Rate  Route Frequency Ordered Stop   03/28/14 0300  vancomycin (VANCOCIN) IVPB 1000 mg/200 mL premix  Status:  Discontinued     1,000 mg200 mL/hr over 60 Minutes Intravenous Every 12 hours 03/27/14 1429 03/29/14 1141   03/27/14 2100  piperacillin-tazobactam (ZOSYN) IVPB 3.375 g     3.375 g12.5 mL/hr over 240 Minutes Intravenous Every 8 hours 03/27/14 1429     03/27/14 1430  vancomycin (VANCOCIN) 2,000 mg in sodium chloride 0.9 % 500 mL IVPB     2,000 mg250 mL/hr over 120 Minutes Intravenous STAT 03/27/14 1428 03/30/14 0942   03/27/14 1430  piperacillin-tazobactam (ZOSYN) IVPB 3.375 g     3.375  g100 mL/hr over 30 Minutes Intravenous  Once 03/27/14 1428 03/27/14 1912       Assessment/Plan  1. VDRF 2. Pneumatosis on CT scan, no abdominal pain  Plan: 1. Patient with no abdominal pain.  No need for surgical intervention.  She is stable for LTAC when medically stable.  Trach pending per CCM.  We will sign off.   LOS: 5 days    Nickolas Chalfin E 04/01/2014, 8:54 AM Pager: [phone]

## 2014-04-01 NOTE — Progress Notes (Signed)
D:  Pt combative, attempting to hit staff, pull out lines/tubes, and get out of bed.  Pt not allowing RN to put BP cuff on. A:  Fentanyl 100mcg and versed 2mg  given IV with no change in agitation level.  Pt restrainted per MD order.  Will monitor pt.

## 2014-04-01 NOTE — Procedures (Signed)
Extubation Procedure Note  Patient Details:   Name: Noah CharonMartha J Strohmeier DOB: 10-06-1945 MRN: [id number]   Pt extubated to 4L [place] per MD order. No stridor noted, pt able to vocalize, VS WNL. Pt tolerating well at this time, RT will continue to monitor.    Evaluation  O2 sats: stable throughout Complications: No apparent complications Patient did tolerate procedure well. Bilateral Breath Sounds: Diminished Suctioning: Airway Yes  Harley HallmarkKeen, Jameia Makris Lyman 04/01/2014, 11:36 AM

## 2014-04-01 NOTE — Progress Notes (Signed)
PULMONARY / CRITICAL CARE MEDICINE  Name: Amanda CharonMartha J Yang  MRN: [id number]  DOB: 1945-07-13    ADMISSION DATE: [facility]/2014 ([facility] REFERRING MD : Admit from [facility]   INITIAL PRESENTATION:  68 y/o female initially admitted to [facility] with ETT for vent wean / ?trach in setting PNA, Afib RVR. Developed worsening AMS and n/v and CT abd concerning for necrotic bowel. [facility] eval/ admission.   BAseline elicited 03/31/14: baseline independent except a Fib on eliquis and DM. [place]ub[place] 3 weeks prior [place] and failed extubation x 1. [place] [facility] [place] able to work for ~ 30 years, drives, manages her own meds. Her brother is her medical POA. Amanda Earingalvin Yang.   STUDIES/EVENTS: 10/15 - 10/23 - [name] -? For group B strep 03/21/14 - TX to [facility] at [name] 03/22/14 - MRSA Trach aspirate + 03/27/14 - MOVE TO [place]  10/29 CT Ab/Pelvis > Free air from possible necrotic small bowel, cholilithiasis, small bilateral pleural effusions and bibasilar atelectasis  10/29  tx [facility]  10/29  CCS consult: No indication for immediate surgery. CCS to to continue to follow clinically 10/31  Weaning on 14/5, 40%, benign abd exam  03/30/14:  RT reports pt weaning on PSV 14/5 due to low volumes, tolerating well.  No acute distress 03/31/14:  CCM ruled out acute abdomen. Pall care consult: terminal wean ony if vegetatie. Full code +, Amanda Yang as option +   SUBJECTIVE/OVERNIGHT/INTERVAL HX 04/01/14:  Pall care notes noted. Weaned all day yesterday. Tolerating tube feeds; had bowel movement. CCS ruled out acute abdomen. Overnight had sun downing but this AM calm and following commands - without sedation   VITAL SIGNS:  Filed Vitals:   04/01/14 0816 04/01/14 0821 04/01/14 0900 04/01/14 0958  BP:  156/88 138/80   Pulse:  97 88 97  Temp:      TempSrc:      Resp:  22 23   Height:      Weight:       SpO2: 100% 100% 100%    HEMODYNAMICS:   VENTILATOR SETTINGS:  Vent Mode:  [-] PSV FiO2 (%):  [40 %] 40 % Set Rate:  [14 bmp] 14 bmp Vt Set:  [450 mL] 450 mL PEEP:  [5 cmH20] 5 cmH20 Pressure Support:  [5 cmH20] 5 cmH20 Plateau Pressure:  [19 cmH20-21 cmH20] 21 cmH20  INTAKE / OUTPUT:  I/O last 3 completed shifts: In: 3283.8 [I.V.:2471.3; NG/GT:537.5; IV Piggyback:275] Out: 3660 [Urine:3060; Emesis/NG output:600] Total I/O In: 255 [I.V.:100; NG/GT:130; IV Piggyback:25] Out: 700 [Urine:700]   PHYSICAL EXAMINATION:  General: chronically ill in NAD Neuro: , no focal deficits, cognition appears intact good cough, good Vt, good head strength, following simple commands, calm HEENT: OETT, mm pink/moist, NGT in place , cuff leak + Lungs: Diminished throughout Abdomen: Obese, mildly distended, decreased BS but present, non tender or minimally tender, no rebound or guarding  Ext: BUE edema, no LE edema   LABS:   PULMONARY  Recent Labs Lab 03/27/14 1643  PHART 7.521*  PCO2ART 56.6*  PO2ART 64.0*  HCO3 46.3*  TCO2 48  O2SAT 93.0    CBC  Recent Labs Lab 03/30/14 0431 03/31/14 0424 04/01/14 0439  HGB 10.5* 10.4* 11.0*  HCT 33.6* 34.0* 34.9*  WBC 8.9 10.1 12.1*  PLT 488* 488* 542*    COAGULATION  Recent Labs Lab 03/27/14 0500  INR 1.18    CARDIAC  No results for input(s): TROPONINI in the last 168 hours.  Recent Labs Lab 04/01/14 0439  PROBNP 909.6*     CHEMISTRY  Recent Labs Lab 03/26/14 0815 03/27/14 0500 03/27/14 1434 03/28/14 0430 03/30/14 0431 03/31/14 0424 04/01/14 0439  NA 142 140  --  139 140 141 139  K 3.2* 3.2*  --  3.2* 4.3 4.8 4.5  CL 92* 89*  --  89* 99 102 100  CO2 42* 43*  --  41* 32 31 28  GLUCOSE 214* 262*  --  94 144* 143* 208*  BUN 27* 27*  --  23 9 7 9   CREATININE 0.89 0.87  --  1.04 0.99 1.03 0.99  CALCIUM 8.8 8.5  --  8.3* 8.3* 8.5 8.6  MG 1.4* 1.8 1.8 1.6  --  1.8 2.1  PHOS 3.4 3.1 3.4 3.1  --   --  2.8    Estimated Creatinine Clearance: 63.1 mL/min (by C-G formula based on Cr of 0.99).   LIVER  Recent Labs Lab 03/27/14 0500  AST 95*  ALT 157*  ALKPHOS 77  BILITOT 0.8  PROT 6.1  ALBUMIN 2.0*  INR 1.18     INFECTIOUS  Recent Labs Lab 03/27/14 1434 03/29/14 0400  LATICACIDVEN 1.4 1.1  PROCALCITON 0.39  --      ENDOCRINE CBG (last 3)   Recent Labs  03/31/14 2351 04/01/14 0405 04/01/14 0814  GLUCAP 135* 176* 153*         IMAGING x48h Dg Chest Port 1 View  04/01/2014   CLINICAL DATA:  Acute respiratory failure ; history of diabetes, atrial fibrillation, and morbid obesity  EXAM: PORTABLE CHEST - 1 VIEW  COMPARISON:  Portable chest x-ray of March 31, 2014  FINDINGS: The lungs remain well-expanded. The interstitial markings have increased bilaterally. The left hemidiaphragm is less well demonstrated today. The cardiopericardial silhouette remains enlarged. The pulmonary vascularity is more engorged centrally today.  The endotracheal tube tip lies approximately 3.5 cm above the crotch of the carina. The esophagogastric tube tip projects below the inferior margin of the image. The right internal jugular venous catheter tip projects over the junction of the proximal and midportions of the SVC.  IMPRESSION: There has been interval deterioration in the appearance of the pulmonary interstitium and the lung bases consistent with interstitial edema and bibasilar atelectasis.   Electronically Signed   By: Amanda  Yang   On: 04/01/2014 07:37   Dg Chest Port 1 View  03/31/2014   CLINICAL DATA:  Acute respiratory failure.  EXAM: PORTABLE CHEST - 1 VIEW  COMPARISON:  03/30/2014.  FINDINGS: Unchanged support tubes and apparatus. Cardiomegaly. No significant pulmonary edema. Slight LEFT base opacity is improved. No pneumothorax.  IMPRESSION: Cardiomegaly.  Improved aeration.  Stable support apparatus.   Electronically Signed   By: Davonna BellingJohn  Yang M.D.   On: 03/31/2014 07:22        ASSESSMENT / PLAN:  PULMONARY  OETT 10/20 ([name])>>>  A:  Acute on chronic respiratory failure requiring prolonged ventilation  RLL PNA - s/p Abx therapy completed  OSA with hypoventilation syndrome Pleural Effusions - small, bilateral R>L   - doing SBT , appears to meet extubation criteria. Has new LLL consolidation 04/01/14 but could be fluid too (see ID and cards section)  P:  Extubate; after lasix BiPAP QHS for possible OSA If fails extubation , then reitnubate and trach See ID  CARDIOVASCULAR   CVL R IJ CVL [facility]([name]) >>  H/O HTN  AFRVR > NSR, previously on eliquis outpt, treated with digoxin and lovenox in [facility]  Borderline hypotension - resolved.     - maintaining bp/hr but has high BNP  P:  Lasix scheduled start Check ECHO, track bnp Cont digoxin  Tele monitoring  Holding B blocker, ACEI  DVT:  Heparin SQ  (decide on eliquis if belly issues resolve and tolerates tube feeds)  RENAL  AKI - resolved.  Hyponatremia - resolved  Hypokalemia and Hypochloremic - likely due to diarrhea and NGT  Hematuria - gross w/o UTI, resolving.    - normal lytes P:  Monitor BMET intermittently Monitor I/Os Change fluids to d5 half normal at kv   GASTROINTESTINAL  Abd pain - improving to resolved Ileus  ?Ischemic bowel - free air on CT abd but neg exam.   Elevated LFTs,  resolved  Cholelithiasis with calcified gallbladder, w/o cholecytitis    - nil acute per CCS 03/31/14. BS +. Minimal NG returns +. Tolerating tube feeds 04/01/14 and had bowel movement  P:  Hold tube feeds for extubation  SUP: IV PPI Monitor medically   HEMATOLOGIC  Coagulopathy - in setting of full dose lovenox  P:  DVT px: SQ heparin (restart full anticoag 04/02/14 for a fib) Monitor CBC intermittently Transfuse per usual ICU guidelines   INFECTIOUS  03/22/14 TRacheal aspirate - MRSA + 03/27/14 - MRSA PCR [facility]  - POSITIVE 03/27/14 - Blood culture x 2 04/01/14: Resp  culture    A:  Concern for abd sepsis due to ischemic bowel Recent RLL PNA  MRSA colonization   - nil acute 03/31/14 and 04/01/14 but PCT high and not sure if MRSA treated. Has new LLL consolidation11/3/15 P:  Zosyn 10/29 >>> 04/01/14 Flagyl 10/29 >>>  110/30/15 Vanc 03/27/14 >>03/29/14, 04/02/14 (given LLL consolidation and slight high PCT, recheck cukltures done) >>  Con't zosyn and follow clinically   ENDOCRINE  DM   P:  Cont SSI   NEUROLOGIC  A:  Encephalopathy with multiple etiologies (metabolic +/- hepatic) - resolved to baseline Baseline cognitive impairment    - normal mental status 11//2/15 and 04/01/14  P:  RASS goal: 0   Family Updates: See pall care note 03/31/14    Today's Summary:  Extubate under cover of lasix. Wil check echo. Needs bipap qhs for possible OSa. If fails, reintubate and do trach   The patient is critically ill with multiple organ systems failure and requires high complexity decision making for assessment and support, frequent evaluation and titration of therapies, application of advanced monitoring technologies and extensive interpretation of multiple databases.   Critical Care Time devoted to patient care services described in this note is  30  Minutes. This time reflects time of care of this signee Dr Kalman Shan. This critical care time does not reflect procedure time, or teaching time or supervisory time of PA/NP/Med student/Med Resident etc but could involve care discussion time    Dr. Kalman Shan, M.D., [facility].C.P Pulmonary and Critical Care Medicine Staff Physician [facility] Pulmonary and Critical Care Pager: [phone], If no answer or between  15:00h - 7:00h: call [phone]  04/01/2014 10:13 AM

## 2014-04-01 NOTE — Progress Notes (Signed)
Amanda Yang was just extubated this morning, I did call and let her brother Cal know. He did not answer but I left him a message. Amanda Yang was a little agitated, saying "I want out of here" but calmed when I explained that she has been very ill and that we need to work with her to get her stronger. Her voice is very weak and hoarse but she seems to be oriented and appropriate in conversation but difficult to understand at times. I will continue to follow and hopefully support and learn more about Amanda Yang and what is important to her as she hopefully becomes more easily able to converse.   Yong ChannelAlicia Jaquelin Meaney, NP Palliative Medicine Team Pager # [phone] (M-F 8a-5p) Team Phone # [phone] (Nights/Weekends)

## 2014-04-01 NOTE — Progress Notes (Signed)
ANTIBIOTIC CONSULT NOTE - Initial  Pharmacy Consult for Vancomycin Indication: ? Consolidation PNA  Allergies  Allergen Reactions  . Pollen Extract     Patient Measurements: Height: 5\' 4"  (162.6 cm) Weight: 230 lb (104.327 kg) IBW/kg (Calculated) : 54.7  Vital Signs: Temp: 98.5 F (36.9 C) (11/03 0403) Temp Source: Oral (11/03 0403) BP: 147/71 mmHg (11/03 1135) Pulse Rate: 99 (11/03 1135) Intake/Output from previous day: 11/02 0701 - 11/03 0700 In: 2193.8 [I.V.:1481.3; NG/GT:537.5; IV Piggyback:175] Out: 2410 [Urine:2210; Emesis/NG output:200] Intake/Output from this shift: Total I/O In: 255 [I.V.:100; NG/GT:130; IV Piggyback:25] Out: 700 [Urine:700]  Labs:  Recent Labs  03/30/14 0431 03/31/14 0424 04/01/14 0439  WBC 8.9 10.1 12.1*  HGB 10.5* 10.4* 11.0*  PLT 488* 488* 542*  CREATININE 0.99 1.03 0.99   Estimated Creatinine Clearance: 63.1 mL/min (by C-G formula based on Cr of 0.99).  Assessment: 68yof recently on antibiotics for abdominal sepsis d/t ? perf, ischemic bowel. 11/3: new LLL consolidation PNA. WBC elevated today 12.1, currently afebrile, renal function remains stable.   Unasyn PTA 10/15>>10/29 Zosyn 10/29>>11/3 Vanc 10/29>>10/31, 11/3 >>  10/29 Bld x2>>ngtd 10/24 TA - MRSA (colonization) 10/28 CDiff - NEG  Goal of Therapy:  Appropriate dosing  Plan:  1) Vancomycin 1500 mg loading dose x 1 2) Vancomycin 1 g Q 12 hrs 2) Continue to follow renal function, cultures, LOT, VT after 4 doses  Chesley NoonAlam, Ruhaniyah, PharmD candidate 04/01/2014,11:47 AM  I have reviewed and agree with above assessment and plan.  Link SnufferJessica Carmilla [place], PharmD, BCPS Clinical Pharmacist [phone] 04/01/2014, 2:10 PM

## 2014-04-01 NOTE — Progress Notes (Signed)
Patient removed central line. Patient CAM ICU +. 2 peripheral IVs started.  Amanda Yang, Rosalba Totty Rosanne

## 2014-04-02 ENCOUNTER — Inpatient Hospital Stay (HOSPITAL_COMMUNITY): Payer: Medicare Other

## 2014-04-02 ENCOUNTER — Inpatient Hospital Stay
Admission: AD | Admit: 2014-04-02 | Discharge: 2014-05-16 | Disposition: A | Discharge status: Skilled Nursing Facility | Payer: Self-pay | Source: Ambulatory Visit | Attending: Internal Medicine | Admitting: Internal Medicine

## 2014-04-02 DIAGNOSIS — J9620 Acute and chronic respiratory failure, unspecified whether with hypoxia or hypercapnia: Secondary | ICD-10-CM | POA: Diagnosis present

## 2014-04-02 DIAGNOSIS — Z90710 Acquired absence of both cervix and uterus: Secondary | ICD-10-CM | POA: Insufficient documentation

## 2014-04-02 DIAGNOSIS — K9422 Gastrostomy infection: Secondary | ICD-10-CM | POA: Insufficient documentation

## 2014-04-02 DIAGNOSIS — Z930 Tracheostomy status: Secondary | ICD-10-CM

## 2014-04-02 DIAGNOSIS — J9601 Acute respiratory failure with hypoxia: Secondary | ICD-10-CM | POA: Insufficient documentation

## 2014-04-02 DIAGNOSIS — J15212 Pneumonia due to Methicillin resistant Staphylococcus aureus: Principal | ICD-10-CM

## 2014-04-02 DIAGNOSIS — Z452 Encounter for adjustment and management of vascular access device: Secondary | ICD-10-CM

## 2014-04-02 DIAGNOSIS — Z4659 Encounter for fitting and adjustment of other gastrointestinal appliance and device: Secondary | ICD-10-CM

## 2014-04-02 DIAGNOSIS — I4891 Unspecified atrial fibrillation: Secondary | ICD-10-CM

## 2014-04-02 DIAGNOSIS — R0603 Acute respiratory distress: Secondary | ICD-10-CM

## 2014-04-02 DIAGNOSIS — I517 Cardiomegaly: Secondary | ICD-10-CM

## 2014-04-02 DIAGNOSIS — J811 Chronic pulmonary edema: Secondary | ICD-10-CM

## 2014-04-02 DIAGNOSIS — J9690 Respiratory failure, unspecified, unspecified whether with hypoxia or hypercapnia: Secondary | ICD-10-CM

## 2014-04-02 DIAGNOSIS — E46 Unspecified protein-calorie malnutrition: Secondary | ICD-10-CM

## 2014-04-02 DIAGNOSIS — L03319 Cellulitis of trunk, unspecified: Secondary | ICD-10-CM

## 2014-04-02 DIAGNOSIS — Z9289 Personal history of other medical treatment: Secondary | ICD-10-CM

## 2014-04-02 LAB — GLUCOSE, CAPILLARY
GLUCOSE-CAPILLARY: 133 mg/dL — AB (ref 70–99)
GLUCOSE-CAPILLARY: 130 mg/dL — AB (ref 70–99)
GLUCOSE-CAPILLARY: 133 mg/dL — AB (ref 70–99)
Glucose-Capillary: 120 mg/dL — ABNORMAL HIGH (ref 70–99)

## 2014-04-02 LAB — CBC WITH DIFFERENTIAL/PLATELET
BASOS PCT: 1 % (ref 0–1)
Basophils Absolute: 0.1 10*3/uL (ref 0.0–0.1)
EOS ABS: 0.3 10*3/uL (ref 0.0–0.7)
EOS PCT: 3 % (ref 0–5)
HCT: 38.6 % (ref 36.0–46.0)
HEMOGLOBIN: 11.9 g/dL — AB (ref 12.0–15.0)
LYMPHS ABS: 1.3 10*3/uL (ref 0.7–4.0)
Lymphocytes Relative: 11 % — ABNORMAL LOW (ref 12–46)
MCH: 30.5 pg (ref 26.0–34.0)
MCHC: 30.8 g/dL (ref 30.0–36.0)
MCV: 99 fL (ref 78.0–100.0)
MONO ABS: 1.2 10*3/uL — AB (ref 0.1–1.0)
Monocytes Relative: 10 % (ref 3–12)
Neutro Abs: 9.2 10*3/uL — ABNORMAL HIGH (ref 1.7–7.7)
Neutrophils Relative %: 75 % (ref 43–77)
Platelets: 533 10*3/uL — ABNORMAL HIGH (ref 150–400)
RBC: 3.9 MIL/uL (ref 3.87–5.11)
RDW: 14.9 % (ref 11.5–15.5)
WBC: 12.1 10*3/uL — ABNORMAL HIGH (ref 4.0–10.5)

## 2014-04-02 LAB — BLOOD GAS, ARTERIAL
Acid-Base Excess: 7.9 mmol/L — ABNORMAL HIGH (ref 0.0–2.0)
BICARBONATE: 32.3 meq/L — AB (ref 20.0–24.0)
O2 CONTENT: 4 L/min
O2 Saturation: 96.2 %
PATIENT TEMPERATURE: 98.6
PH ART: 7.433 (ref 7.350–7.450)
TCO2: 33.8 mmol/L (ref 0–100)
pCO2 arterial: 49.1 mmHg — ABNORMAL HIGH (ref 35.0–45.0)
pO2, Arterial: 80.7 mmHg (ref 80.0–100.0)

## 2014-04-02 LAB — CULTURE, BLOOD (ROUTINE X 2)
CULTURE: NO GROWTH
Culture: NO GROWTH

## 2014-04-02 LAB — BASIC METABOLIC PANEL
Anion gap: 12 (ref 5–15)
BUN: 10 mg/dL (ref 6–23)
CO2: 33 mEq/L — ABNORMAL HIGH (ref 19–32)
CREATININE: 1.01 mg/dL (ref 0.50–1.10)
Calcium: 8.9 mg/dL (ref 8.4–10.5)
Chloride: 98 mEq/L (ref 96–112)
GFR calc Af Amer: 64 mL/min — ABNORMAL LOW (ref >90)
GFR, EST NON AFRICAN AMERICAN: 55 mL/min — AB (ref >90)
GLUCOSE: 146 mg/dL — AB (ref 70–99)
Potassium: 4.1 mEq/L (ref 3.7–5.3)
Sodium: 143 mEq/L (ref 137–147)

## 2014-04-02 LAB — MAGNESIUM: Magnesium: 1.8 mg/dL (ref 1.5–2.5)

## 2014-04-02 LAB — PHOSPHORUS: Phosphorus: 3.6 mg/dL (ref 2.3–4.6)

## 2014-04-02 MED ORDER — PANTOPRAZOLE SODIUM 40 MG IV SOLR: 40.0000 mg | Freq: Every day | INTRAVENOUS | Status: AC

## 2014-04-02 MED ORDER — POTASSIUM CHLORIDE 20 MEQ/15ML (10%) PO SOLN: 40.0000 meq | Freq: Two times a day (BID) | ORAL | Status: AC

## 2014-04-02 MED ORDER — PRO-STAT SUGAR FREE PO LIQD: 30.0000 mL | Freq: Every day | ORAL | Status: AC

## 2014-04-02 MED ORDER — DIGOXIN 250 MCG PO TABS: 0.2500 mg | ORAL_TABLET | Freq: Every day | ORAL | Status: AC

## 2014-04-02 MED ORDER — VITAL HIGH PROTEIN PO LIQD: 1000.0000 mL | ORAL | Status: AC

## 2014-04-02 MED ORDER — SODIUM CHLORIDE 0.9 % IV SOLN: 250.0000 mL | INTRAVENOUS | Status: AC | PRN

## 2014-04-02 MED ORDER — IPRATROPIUM-ALBUTEROL 0.5-2.5 (3) MG/3ML IN SOLN: 3.0000 mL | Freq: Four times a day (QID) | RESPIRATORY_TRACT | Status: AC

## 2014-04-02 MED ORDER — SORBITOL 70 % SOLN: 30.0000 mL | Freq: Every day | Status: AC | PRN

## 2014-04-02 MED ORDER — COLLAGENASE 250 UNIT/GM EX OINT: TOPICAL_OINTMENT | Freq: Every day | CUTANEOUS | Status: AC

## 2014-04-02 MED ORDER — INSULIN GLARGINE 100 UNIT/ML ~~LOC~~ SOLN: 5.0000 [IU] | Freq: Every day | SUBCUTANEOUS | Status: AC

## 2014-04-02 MED ORDER — INSULIN ASPART 100 UNIT/ML ~~LOC~~ SOLN: 0.0000 [IU] | SUBCUTANEOUS | Status: AC

## 2014-04-02 MED ORDER — FUROSEMIDE 10 MG/ML IJ SOLN: 20.0000 mg | Freq: Every day | INTRAMUSCULAR | Status: DC

## 2014-04-02 MED ORDER — FUROSEMIDE 10 MG/ML IJ SOLN: 20.0000 mg | Freq: Every day | INTRAMUSCULAR | Status: AC

## 2014-04-02 MED ORDER — VANCOMYCIN HCL IN DEXTROSE 1-5 GM/200ML-% IV SOLN: 1000.0000 mg | Freq: Two times a day (BID) | INTRAVENOUS | Status: AC

## 2014-04-02 NOTE — Progress Notes (Signed)
Attempted bipap per md rx.  Pt was very agitated with bipap on and wore it for less than an hour.  Dr Sung AmabileSimonds viewed pt on elink camera and was ok to discontinue bipap at this time.  Pt placed back on 4lnc.  RN aware, at bedside.

## 2014-04-02 NOTE — Discharge Summary (Signed)
Physician Discharge Summary  Patient ID: Amanda Yang MRN: [id number] DOB/AGE: May 09, 1946 68 y.o.  Admit date: 03/27/2014 Discharge date: 04/02/2014  Problem List Active Problems:   Abdominal pain   Respiratory failure   Altered mental status   Atrial fibrillation   MRSA pneumonia   Acute on chronic respiratory failure, unspecified whether with hypoxia or hypercapnia  HPI: 68yo female with hx DM, AFib, AKI, HTN, baseline cognitive impairment initially [facility] with acute respiratory failure r/t PNA. Failed bipap, requiring intubation, rx with abx for Group B strep but failed attempt at extubation. Re-intubated and tx to [facility] 10/23 for weaning and ?trach. Course has been c/b AKI, metabolic encephalopathy, transaminitis. While in [facility] developed nausea and vomiting, Abd Xray revealed ?SBO and CT abd was done which was concerning for ischemic bowel and pt was tx to ER/[facility] for admission and surgical evaluation.  Hospital Course:  STUDIES/EVENTS: 10/15 - 10/23 - [name] -? For group B strep 03/21/14 - TX to [facility] at [name] 03/22/14 - MRSA Trach aspirate + 03/27/14 - MOVE TO [facility]  10/29 CT Ab/Pelvis > Free air from possible necrotic small bowel, cholilithiasis, small bilateral pleural effusions and bibasilar atelectasis  10/29 tx to [facility] from [facility]  10/29 CCS consult: No indication for immediate surgery. CCS to to continue to follow clinically 10/31 Weaning on 14/5, 40%, benign abd exam  03/30/14: RT reports pt weaning on PSV 14/5 due to low volumes, tolerating well. No acute distress 03/31/14: CCM ruled out acute abdomen. Pall care consult: terminal wean ony if vegetatie. Full code +, Janina Mayorach as option + 04/01/14 - EXTUBATED 11/3 CCS signed off 11/4 refuses N[facility]VS [facility]   ASSESSMENT / PLAN:  PULMONARY  OETT 10/20 ([name])>>> 04/01/14 ([facility]) A:  Suspected baseline OSA/OHV Acute on chronic respiratory failure duerequiring  prolonged ventilation  - due to GRoup B strep CAP followed by MRSA PNA - extubated 04/01/14  - doing well off vent 04/02/14 but not tolerating bipap qhs  P:  DC BiPAP QHS  Aggressive pulm toilet to continue O2 for pulse ox> 92%  CARDIOVASCULAR  CVL R IJ CVL ([name]) >>   Baseline - H/O HTN  - AFRVR > NSR, previously on eliquis outpt, treated with digoxin and lovenox   Current - 113/15: suspected diast dysfn and lasix started. ECHO ordered  - 04/02/14: appears to be in negative balance    P:  Reduced Lasix scheduled start Await ECHO, track bnp Cont digoxin  Holding B blocker, ACEI  Heparin SQ dvt prop dose (decide on eliquis after 04/03/14 once tolerates tube feeds and maintaning Hgb)  RENAL  AKI - resolved.   - normal lytes 04/02/14  P:  Monitor BMET intermittently Monitor I/Os Fluids to d5 half normal at kv   GASTROINTESTINAL  Cholelithiasis with calcified gallbladder, w/o cholecytitis  Abd pain - improving to resolved  - nil acute per CCS 03/31/14. BS +. Minimal NG returns +. Tolerating tube feeds 04/01/14 and had bowel movement and same 04/02/14  P:  Speech swallow eval; till then NPO  SUP: IV PPI Monitor medically   HEMATOLOGIC  Coagulopathy - in setting of full dose lovenox at time of admission  - currently mild anemia of critical illness P:  DVT px: SQ heparin (restart full anticoag 04/03/14 for a fib) Monitor CBC intermittently Transfuse per usual ICU guidelines   INFECTIOUS  03/22/14 TRacheal aspirate - MRSA + 03/27/14 - MRSA PCR [facility] - POSITIVE 03/27/14 - Blood  culture x 2 04/01/14: Resp culture    A:  Recent RLL PNA admission at [facility] for GRoup B strep Likely MRSA PNA 03/22/14 with new LLL consolidation 04/01/14 and VAnc started  - no fever 04/02/14  P:  Zosyn 10/29 >>> 04/01/14 Flagyl 10/29 >>> 110/30/15 Vanc 03/27/14 >>03/29/14,  04/02/14 (given LLL consolidation and slight high PCT, recheck cukltures done) >>    ENDOCRINE  DM   P:  Cont SSI   NEUROLOGIC  A:  Encephalopathy with multiple etiologies (metabolic +/- hepatic) - resolved to baseline Baseline cognitive impairment   - normal mental status 11//2/15 and 04/01/14, sleeping 04/02/14  P:  RASS goal: 0  Discussion:  Remains full code. Evaluated by palliative care. Transfer back to [facility], extubated and HD stable.   Labs at discharge Lab Results  Component Value Date   CREATININE 1.01 04/02/2014   BUN 10 04/02/2014   NA 143 04/02/2014   K 4.1 04/02/2014   CL 98 04/02/2014   CO2 33* 04/02/2014   Lab Results  Component Value Date   WBC 12.1* 04/02/2014   HGB 11.9* 04/02/2014   HCT 38.6 04/02/2014   MCV 99.0 04/02/2014   PLT 533* 04/02/2014   Lab Results  Component Value Date   ALT 157* 03/27/2014   AST 95* 03/27/2014   ALKPHOS 77 03/27/2014   BILITOT 0.8 03/27/2014   Lab Results  Component Value Date   INR 1.18 03/27/2014    Current radiology studies Dg Chest Port 1 View  04/01/2014   CLINICAL DATA:  Acute respiratory failure ; history of diabetes, atrial fibrillation, and morbid obesity  EXAM: PORTABLE CHEST - 1 VIEW  COMPARISON:  Portable chest x-ray of March 31, 2014  FINDINGS: The lungs remain well-expanded. The interstitial markings have increased bilaterally. The left hemidiaphragm is less well demonstrated today. The cardiopericardial silhouette remains enlarged. The pulmonary vascularity is more engorged centrally today.  The endotracheal tube tip lies approximately 3.5 cm above the crotch of the carina. The esophagogastric tube tip projects below the inferior margin of the image. The right internal jugular venous catheter tip projects over the junction of the proximal and midportions of the SVC.  IMPRESSION: There has been interval deterioration in the appearance of the pulmonary interstitium and the lung bases consistent  with interstitial edema and bibasilar atelectasis.   Electronically Signed   By: David  [place]   On: 04/01/2014 07:37    Disposition:  04-Intermediate Care Facility  Discharge Instructions    Discharge patient    Complete by:  As directed   DC to [facility] when bed ready            Medication List    STOP taking these medications        amLODipine 10 MG tablet  Commonly known as:  NORVASC     calcipotriene 0.005 % ointment  Commonly known as:  DOVONOX     ELIQUIS 5 MG Tabs tablet  Generic drug:  apixaban     fluticasone 50 MCG/ACT nasal spray  Commonly known as:  FLONASE     halobetasol 0.05 % ointment  Commonly known as:  ULTRAVATE     LEVEMIR FLEXTOUCH 100 UNIT/ML Pen  Generic drug:  Insulin Detemir     metoprolol 50 MG tablet  Commonly known as:  LOPRESSOR     spironolactone 25 MG tablet  Commonly known as:  ALDACTONE     TRADJENTA 5 MG Tabs tablet  Generic drug:  linagliptin  triamcinolone cream 0.1 %  Commonly known as:  KENALOG      TAKE these medications        collagenase ointment  Commonly known as:  SANTYL  Apply topically daily.     digoxin 0.25 MG tablet  Commonly known as:  LANOXIN  Place 1 tablet (0.25 mg total) into feeding tube daily.     feeding supplement (PRO-STAT SUGAR FREE 64) Liqd  Place 30 mLs into feeding tube daily at 12 noon.     feeding supplement (VITAL HIGH PROTEIN) Liqd liquid  Place 1,000 mLs into feeding tube daily.     furosemide 10 MG/ML injection  Commonly known as:  LASIX  Inject 2 mLs (20 mg total) into the vein daily.  Start taking on:  04/03/2014     insulin aspart 100 UNIT/ML injection  Commonly known as:  novoLOG  Inject 0-20 Units into the skin every 4 (four) hours.     insulin glargine 100 UNIT/ML injection  Commonly known as:  LANTUS  Inject 0.05 mLs (5 Units total) into the skin at bedtime.     ipratropium-albuterol 0.5-2.5 (3) MG/3ML Soln  Commonly known as:  DUONEB  Take 3 mLs by nebulization  every 6 (six) hours.     pantoprazole 40 MG injection  Commonly known as:  PROTONIX  Inject 40 mg into the vein at bedtime.     potassium chloride 20 MEQ/15ML (10%) Soln  Place 30 mLs (40 mEq total) into feeding tube 2 (two) times daily.     sodium chloride 0.9 % infusion  Inject 250 mLs into the vein as needed (if IV carrier fluid needed.).     sorbitol 70 % Soln  Take 30 mLs by mouth daily as needed for moderate constipation.     vancomycin 1 GM/200ML Soln  Commonly known as:  VANCOCIN  Inject 200 mLs (1,000 mg total) into the vein every 12 (twelve) hours.          Discharged Condition: fair  Time spent on discharge greater than 40 minutes.  Vital signs at Discharge. Temp:  [98 F (36.7 C)-98.4 F (36.9 C)] 98.4 F (36.9 C) (11/04 0824) Pulse Rate:  [32-118] 107 (11/04 0925) Resp:  [15-27] 19 (11/04 0925) BP: (96-167)/(47-111) 134/60 mmHg (11/04 0900) SpO2:  [92 %-100 %] 99 % (11/04 0925) FiO2 (%):  [40 %-45 %] 45 % (11/04 0900) Weight:  [212 lb (96.163 kg)] 212 lb (96.163 kg) (11/04 0500) Office follow up Special Information or instructions. [facility] Signed: Brett CanalesSteve Minor ACNP Adolph PollackLe Bauer PCCM Pager [phone] till 3 pm If no answer page [phone] 04/02/2014, 10:44 AM

## 2014-04-02 NOTE — Progress Notes (Signed)
Echocardiogram 2D Echocardiogram has been performed.  Dorothey BasemanReel, Kirby Argueta M 04/02/2014, 1:24 PM

## 2014-04-02 NOTE — Progress Notes (Signed)
PULMONARY / CRITICAL CARE MEDICINE  Name: Noah CharonMartha J Hollett  MRN: [id number]  DOB: 03-Jun-1945    ADMISSION DATE: 03/27/2014 ([facility] REFERRING MD : Admit from [facility] - [facility]   INITIAL PRESENTATION:  68 y/o female initially admitted to [facility] with ETT for vent wean / ?trach in setting PNA, Afib RVR. Developed worsening AMS and n/v and CT abd concerning for necrotic bowel. [facility].   BAseline elicited 03/31/14: baseline independent except a Fib on eliquis and DM. Intubated 3 weeks prior to 04[place]01/14 [place] for pna and failed extubation x 1. [place] [facility] [place] able to work for ~ 30 years, drives, manages her own meds. Her brother is her medical POA. Gabriel Earingalvin Rotolo.   STUDIES/EVENTS: 10/15 - 10/23 - [name] -? For group B strep 03/21/14 - TX to [facility] at [name] 03/22/14 - MRSA Trach aspirate + 03/27/14 - MOVE TO [facility]  10/29 CT Ab/Pelvis > Free air from possible necrotic small bowel, cholilithiasis, small bilateral pleural effusions and bibasilar atelectasis  10/29  tx to [facility]  10/29  CCS consult: No indication for immediate surgery. CCS to to continue to follow clinically 10/31  Weaning on 14/5, 40%, benign abd exam  03/30/14:  RT reports pt weaning on PSV 14/5 due to low volumes, tolerating well.  No acute distress 03/31/14:  CCM ruled out acute abdomen. Pall care consult: terminal wean ony if vegetatie. Full code +, Trach as option + 04/01/14 - EXTUBATED  SUBJECTIVE/OVERNIGHT/INTERVAL HX 04/02/14: intolerant to  Nocturnal bipap. Did well off bipap. SHe is in 5L negative balance since admission  VITAL SIGNS:  Filed Vitals:   04/02/14 0400 04/02/14 0424 04/02/14 0500 04/02/14 0600  BP: 102/59  102/54 118/72  Pulse: 99  97 86  Temp:  98 F (36.7 C)    TempSrc:  Axillary    Resp: 19  22 18   Height:      Weight:   96.163 kg (212 lb)   SpO2: 98%  100% 100%    HEMODYNAMICS:   VENTILATOR SETTINGS:  Vent Mode:  [-] Other (Comment) FiO2 (%):  [40 %-45 %] 45 % Set Rate:  [10 bmp] 10 bmp PEEP:  [5 cmH20] 5 cmH20  INTAKE / OUTPUT:  I/O last 3 completed shifts: In: 2904.4 [I.V.:1716.9; NG/GT:987.5; IV Piggyback:200] Out: 7110 [Urine:6910; Emesis/NG output:200] Total I/O In: 420 [I.V.:220; IV Piggyback:200] Out: 1935 [Urine:1935]   PHYSICAL EXAMINATION:  General: chronically ill in NAD, Very deconditioned Neuro: ,currently sleeping this AM HEENT: OETT, mm pink/moist, NGT in place , cuff leak + Lungs: Diminished throughout Abdomen: Obese, mildly distended, decreased BS but present, non tender or minimally tender, no rebound or guarding  Ext: BUE edema, no LE edema   LABS:   PULMONARY  Recent Labs Lab 03/27/14 1643  PHART 7.521*  PCO2ART 56.6*  PO2ART 64.0*  HCO3 46.3*  TCO2 48  O2SAT 93.0    CBC  Recent Labs Lab 03/31/14 0424 04/01/14 0439 04/02/14 0253  HGB 10.4* 11.0* 11.9*  HCT 34.0* 34.9* 38.6  WBC 10.1 12.1* 12.1*  PLT 488* 542* 533*    COAGULATION  Recent Labs Lab 03/27/14 0500  INR 1.18    CARDIAC  No results for input(s): TROPONINI in the last 168 hours.  Recent Labs Lab 04/01/14 0439  PROBNP 909.6*     CHEMISTRY  Recent Labs Lab 03/26/14 0815 03/27/14 0500 03/27/14 1434 03/28/14  0430 03/30/14 0431 03/31/14 0424 04/01/14 0439 04/01/14 2220  NA 142 140  --  139 140 141 139 144  K 3.2* 3.2*  --  3.2* 4.3 4.8 4.5 4.1  CL 92* 89*  --  89* 99 102 100 99  CO2 42* 43*  --  41* 32 31 28 34*  GLUCOSE 214* 262*  --  94 144* 143* 208* 109*  BUN 27* 27*  --  23 9 7 9 10   CREATININE 0.89 0.87  --  1.04 0.99 1.03 0.99 1.02  CALCIUM 8.8 8.5  --  8.3* 8.3* 8.5 8.6 9.2  MG 1.4* 1.8 1.8 1.6  --  1.8 2.1  --   PHOS 3.4 3.1 3.4 3.1  --   --  2.8  --    Estimated Creatinine Clearance: 58.6 mL/min (by C-G formula based on Cr of 1.02).   LIVER  Recent Labs Lab 03/27/14 0500  AST 95*  ALT  157*  ALKPHOS 77  BILITOT 0.8  PROT 6.1  ALBUMIN 2.0*  INR 1.18     INFECTIOUS  Recent Labs Lab 03/27/14 1434 03/29/14 0400  LATICACIDVEN 1.4 1.1  PROCALCITON 0.39  --      ENDOCRINE CBG (last 3)   Recent Labs  04/01/14 1924 04/01/14 2326 04/02/14 0346  GLUCAP 134* 108* 133*         IMAGING x48h Dg Chest Port 1 View  04/01/2014   CLINICAL DATA:  Acute respiratory failure ; history of diabetes, atrial fibrillation, and morbid obesity  EXAM: PORTABLE CHEST - 1 VIEW  COMPARISON:  Portable chest x-ray of March 31, 2014  FINDINGS: The lungs remain well-expanded. The interstitial markings have increased bilaterally. The left hemidiaphragm is less well demonstrated today. The cardiopericardial silhouette remains enlarged. The pulmonary vascularity is more engorged centrally today.  The endotracheal tube tip lies approximately 3.5 cm above the crotch of the carina. The esophagogastric tube tip projects below the inferior margin of the image. The right internal jugular venous catheter tip projects over the junction of the proximal and midportions of the SVC.  IMPRESSION: There has been interval deterioration in the appearance of the pulmonary interstitium and the lung bases consistent with interstitial edema and bibasilar atelectasis.   Electronically Signed   By: David  [place]   On: 04/01/2014 07:37       ASSESSMENT / PLAN:  PULMONARY  OETT 10/20 ([name])>>> 04/01/14 ([facility]) A:  Suspected baseline OSA/OHV Acute on chronic respiratory failure duerequiring prolonged ventilation   - due to GRoup B strep CAP followed by MRSA PNA  - extubated 04/01/14  - doing well off vent 04/02/14 but not tolerating bipap qhs  P:  DC BiPAP QHS  Aggressive pulm toilet to continue O2 for pulse ox> 92%  CARDIOVASCULAR   CVL R IJ CVL ([name]) >>    Baseline  -  H/O HTN   - AFRVR > NSR, previously on eliquis outpt, treated with digoxin and lovenox   Current  - 113/15: suspected  diast dysfn and lasix started. ECHO ordered   - 04/02/14: appears to be in negative balance    P:  Reduced Lasix scheduled start Await  ECHO, track bnp Cont digoxin  Holding B blocker, ACEI  Heparin SQ  dvt prop dose (decide on eliquis after 04/03/14 once  tolerates tube feeds and maintaning Hgb)  RENAL  AKI - resolved.    - normal lytes 04/02/14  P:  Monitor BMET intermittently Monitor I/Os Fluids to d5 half normal  at kv   GASTROINTESTINAL  Cholelithiasis with calcified gallbladder, w/o cholecytitis  Abd pain - improving to resolved   - nil acute per CCS 03/31/14. BS +. Minimal NG returns +. Tolerating tube feeds 04/01/14 and had bowel movement and same 04/02/14  P:  Speech swallow eval; till then NPO  SUP: IV PPI Monitor medically   HEMATOLOGIC  Coagulopathy - in setting of full dose lovenox at time of admission   - currently mild anemia of critical illness P:  DVT px: SQ heparin (restart full anticoag 04/03/14 for a fib) Monitor CBC intermittently Transfuse per usual ICU guidelines   INFECTIOUS  03/22/14 TRacheal aspirate - MRSA + 03/27/14 - [facility] PCR [facility]  - POSITIVE 03/27/14 - Blood culture x 2 04/01/14: Resp culture    A:  Recent RLL PNA admission at [place] for GRoup B strep Likely MRSA PNA 03/22/14 with new LLL consolidation 04/01/14 and VAnc started   - no fever 04/02/14  P:  Zosyn 10/29 >>> 04/01/14 Flagyl 10/29 >>>  110/30/15 Vanc 03/27/14 >>03/29/14, 04/02/14 (given LLL consolidation and slight high PCT, recheck cukltures done) >>    ENDOCRINE  DM   P:  Cont SSI   NEUROLOGIC  A:  Encephalopathy with multiple etiologies (metabolic +/- hepatic) - resolved to baseline Baseline cognitive impairment    - normal mental status 11//2/15 and 04/01/14, sleeping 04/02/14  P:  RASS goal: 0   Family Updates: See pall care note 03/31/14    Today's Summary: Very deconditioned. Move to [facility]; still needs LTAC     Dr. Kalman Shan, M.D.,  [facility] Pulmonary and Critical Care Medicine Staff Physician [facility] Pager: [phone], If no answer or between  15:00h - 7:00h: call [phone]  04/02/2014 6:55 AM

## 2014-04-02 NOTE — Progress Notes (Signed)
eLink Physician-Brief Progress Note Patient Name: Amanda Yang DOB: 6[name]/25/1947 MRN: [id number]   Date of Service  04/02/2014  HPI/Events of Note  Intolerant of BiPAP. Very agitated   eICU Interventions  Order for nocturnal BiPAP DC'd. Looks much more comfortable off BiPAP     Intervention Category Major Interventions: Respiratory failure - evaluation and management  Billy FischerDavid Aivy Akter 04/02/2014, 12:12 AM

## 2014-04-02 NOTE — Evaluation (Signed)
Clinical/Bedside Swallow Evaluation Patient Details  Name: Noah CharonMartha J Taddeo MRN: [id number] Date of Birth: Sep 01, 1945  Today's Date: 04/02/2014 Time: [id number] SLP Time Calculation (min): 17 min  Past Medical History:  Past Medical History  Diagnosis Date  . Hypertension   . A-fib   . Diabetes   . Morbid obesity   . Alopecia   . Transaminitis    Past Surgical History: History reviewed. No pertinent past surgical history. HPI:  68 year old female with PMH:  mild cognitive impairment A-fib, AKI DM, morbid obesity, ileus [facility] w/ acute hypoxic and hypercarbic respiratory failure in the setting of RLL PNA and AMS. Required intubation 10/15, culture demonstrated Group B strep and extubated on 10/20. Pt. developed progressive worsening hypercarbia and re-intubated, extubated 11/3.CXR 11/3 There has been interval deterioration in the appearance of the pulmonary interstitium and the lung bases consistent with interstitial edema and bibasilar atelectasis.   Assessment / Plan / Recommendation Clinical Impression  Pt. essentially aphonic s/p prolonged intubation period (3 weeks).  Weak volitional cough, decreased ability to protect airway.  Delayed swallow initiation with ice chips and high risk for silent aspiration.  Recommend continue NPO with alternate means and continued ST (plans to transfer to [facility]) for increased saliva management, strengthen cough.      Aspiration Risk  Severe    Diet Recommendation NPO        Other  Recommendations Oral Care Recommendations: Oral care BID   Follow Up Recommendations  LTACH    Frequency and Duration        Pertinent Vitals/Pain No pain         Swallow Study       Oral/Motor/Sensory Function Overall Oral Motor/Sensory Function:  (generalized weakness)   Ice Chips Ice chips: Impaired Presentation: Spoon Oral Phase Impairments: Reduced labial seal;Reduced lingual movement/coordination;Impaired anterior to  posterior transit Pharyngeal Phase Impairments: Suspected delayed Swallow;Decreased hyoid-laryngeal movement   Thin Liquid Thin Liquid: Not tested    Nectar Thick Nectar Thick Liquid: Not tested   Honey Thick Honey Thick Liquid: Not tested   Puree Puree: Not tested   Solid   GO    Solid: Not tested       Royce MacadamiaLitaker, Temitayo Covalt Willis 04/02/2014,1:18 PM  Breck CoonsLisa Willis Lonell FaceLitaker M.Ed [employer] Pager [phone]

## 2014-04-03 LAB — PROCALCITONIN: PROCALCITONIN: 0.21 ng/mL

## 2014-04-03 LAB — BASIC METABOLIC PANEL
ANION GAP: 15 (ref 5–15)
BUN: 15 mg/dL (ref 6–23)
CHLORIDE: 99 meq/L (ref 96–112)
CO2: 30 mEq/L (ref 19–32)
Calcium: 9.3 mg/dL (ref 8.4–10.5)
Creatinine, Ser: 1.68 mg/dL — ABNORMAL HIGH (ref 0.50–1.10)
GFR, EST AFRICAN AMERICAN: 35 mL/min — AB (ref >90)
GFR, EST NON AFRICAN AMERICAN: 30 mL/min — AB (ref >90)
Glucose, Bld: 124 mg/dL — ABNORMAL HIGH (ref 70–99)
POTASSIUM: 5 meq/L (ref 3.7–5.3)
Sodium: 144 mEq/L (ref 137–147)

## 2014-04-03 LAB — IRON AND TIBC
Iron: 47 ug/dL (ref 42–135)
Saturation Ratios: 24 % (ref 20–55)
TIBC: 198 ug/dL — AB (ref 250–470)
UIBC: 151 ug/dL (ref 125–400)

## 2014-04-03 LAB — CBC WITH DIFFERENTIAL/PLATELET
BASOS PCT: 1 % (ref 0–1)
Basophils Absolute: 0.1 10*3/uL (ref 0.0–0.1)
EOS ABS: 0.3 10*3/uL (ref 0.0–0.7)
Eosinophils Relative: 2 % (ref 0–5)
HCT: 40 % (ref 36.0–46.0)
Hemoglobin: 12.3 g/dL (ref 12.0–15.0)
Lymphocytes Relative: 10 % — ABNORMAL LOW (ref 12–46)
Lymphs Abs: 1.4 10*3/uL (ref 0.7–4.0)
MCH: 30.7 pg (ref 26.0–34.0)
MCHC: 30.8 g/dL (ref 30.0–36.0)
MCV: 99.8 fL (ref 78.0–100.0)
Monocytes Absolute: 1.3 10*3/uL — ABNORMAL HIGH (ref 0.1–1.0)
Monocytes Relative: 9 % (ref 3–12)
NEUTROS PCT: 78 % — AB (ref 43–77)
Neutro Abs: 10.5 10*3/uL — ABNORMAL HIGH (ref 1.7–7.7)
Platelets: 479 10*3/uL — ABNORMAL HIGH (ref 150–400)
RBC: 4.01 MIL/uL (ref 3.87–5.11)
RDW: 15 % (ref 11.5–15.5)
WBC: 13.6 10*3/uL — ABNORMAL HIGH (ref 4.0–10.5)

## 2014-04-03 LAB — HEPATIC FUNCTION PANEL
ALBUMIN: 2.4 g/dL — AB (ref 3.5–5.2)
ALT: 42 U/L — ABNORMAL HIGH (ref 0–35)
AST: 27 U/L (ref 0–37)
Alkaline Phosphatase: 68 U/L (ref 39–117)
Bilirubin, Direct: 0.2 mg/dL (ref 0.0–0.3)
Indirect Bilirubin: 0.3 mg/dL (ref 0.3–0.9)
Total Bilirubin: 0.5 mg/dL (ref 0.3–1.2)
Total Protein: 6.9 g/dL (ref 6.0–8.3)

## 2014-04-03 LAB — VANCOMYCIN, RANDOM: Vancomycin Rm: 35.4 ug/mL

## 2014-04-03 LAB — SEDIMENTATION RATE: SED RATE: 72 mm/h — AB (ref 0–22)

## 2014-04-03 LAB — C-REACTIVE PROTEIN: CRP: 4.3 mg/dL — ABNORMAL HIGH (ref <0.60)

## 2014-04-04 LAB — BASIC METABOLIC PANEL
Anion gap: 10 (ref 5–15)
BUN: 23 mg/dL (ref 6–23)
CHLORIDE: 99 meq/L (ref 96–112)
CO2: 31 meq/L (ref 19–32)
Calcium: 8.7 mg/dL (ref 8.4–10.5)
Creatinine, Ser: 2.11 mg/dL — ABNORMAL HIGH (ref 0.50–1.10)
GFR calc Af Amer: 26 mL/min — ABNORMAL LOW (ref >90)
GFR calc non Af Amer: 23 mL/min — ABNORMAL LOW (ref >90)
GLUCOSE: 374 mg/dL — AB (ref 70–99)
POTASSIUM: 5.6 meq/L — AB (ref 3.7–5.3)
Sodium: 140 mEq/L (ref 137–147)

## 2014-04-04 LAB — CULTURE, RESPIRATORY

## 2014-04-04 LAB — CBC WITH DIFFERENTIAL/PLATELET
Basophils Absolute: 0.1 10*3/uL (ref 0.0–0.1)
Basophils Relative: 1 % (ref 0–1)
Eosinophils Absolute: 0.3 10*3/uL (ref 0.0–0.7)
Eosinophils Relative: 2 % (ref 0–5)
HCT: 37.8 % (ref 36.0–46.0)
HEMOGLOBIN: 11.4 g/dL — AB (ref 12.0–15.0)
LYMPHS ABS: 1.2 10*3/uL (ref 0.7–4.0)
LYMPHS PCT: 9 % — AB (ref 12–46)
MCH: 30.6 pg (ref 26.0–34.0)
MCHC: 30.2 g/dL (ref 30.0–36.0)
MCV: 101.3 fL — ABNORMAL HIGH (ref 78.0–100.0)
MONOS PCT: 9 % (ref 3–12)
Monocytes Absolute: 1.1 10*3/uL — ABNORMAL HIGH (ref 0.1–1.0)
NEUTROS ABS: 9.9 10*3/uL — AB (ref 1.7–7.7)
NEUTROS PCT: 79 % — AB (ref 43–77)
PLATELETS: 405 10*3/uL — AB (ref 150–400)
RBC: 3.73 MIL/uL — AB (ref 3.87–5.11)
RDW: 14.9 % (ref 11.5–15.5)
WBC: 12.6 10*3/uL — AB (ref 4.0–10.5)

## 2014-04-04 LAB — PROTIME-INR
INR: 1.27 (ref 0.00–1.49)
Prothrombin Time: 16 seconds — ABNORMAL HIGH (ref 11.6–15.2)

## 2014-04-04 LAB — CULTURE, RESPIRATORY W GRAM STAIN

## 2014-04-04 LAB — VANCOMYCIN, RANDOM: Vancomycin Rm: 25.5 ug/mL

## 2014-04-05 LAB — VANCOMYCIN, RANDOM: VANCOMYCIN RM: 18.2 ug/mL

## 2014-04-05 LAB — PROTIME-INR
INR: 1.19 (ref 0.00–1.49)
PROTHROMBIN TIME: 15.3 s — AB (ref 11.6–15.2)

## 2014-04-05 LAB — CBC WITH DIFFERENTIAL/PLATELET
BASOS ABS: 0.1 10*3/uL (ref 0.0–0.1)
Basophils Relative: 1 % (ref 0–1)
EOS ABS: 0.4 10*3/uL (ref 0.0–0.7)
EOS PCT: 3 % (ref 0–5)
HCT: 37.4 % (ref 36.0–46.0)
Hemoglobin: 11.5 g/dL — ABNORMAL LOW (ref 12.0–15.0)
Lymphocytes Relative: 8 % — ABNORMAL LOW (ref 12–46)
Lymphs Abs: 1.1 10*3/uL (ref 0.7–4.0)
MCH: 31.1 pg (ref 26.0–34.0)
MCHC: 30.7 g/dL (ref 30.0–36.0)
MCV: 101.1 fL — AB (ref 78.0–100.0)
Monocytes Absolute: 1 10*3/uL (ref 0.1–1.0)
Monocytes Relative: 7 % (ref 3–12)
NEUTROS PCT: 81 % — AB (ref 43–77)
Neutro Abs: 10.7 10*3/uL — ABNORMAL HIGH (ref 1.7–7.7)
Platelets: 374 10*3/uL (ref 150–400)
RBC: 3.7 MIL/uL — AB (ref 3.87–5.11)
RDW: 14.9 % (ref 11.5–15.5)
WBC: 13.1 10*3/uL — AB (ref 4.0–10.5)

## 2014-04-05 LAB — BASIC METABOLIC PANEL
ANION GAP: 12 (ref 5–15)
BUN: 24 mg/dL — ABNORMAL HIGH (ref 6–23)
CALCIUM: 9.1 mg/dL (ref 8.4–10.5)
CO2: 28 mEq/L (ref 19–32)
Chloride: 99 mEq/L (ref 96–112)
Creatinine, Ser: 2.03 mg/dL — ABNORMAL HIGH (ref 0.50–1.10)
GFR calc Af Amer: 28 mL/min — ABNORMAL LOW (ref >90)
GFR, EST NON AFRICAN AMERICAN: 24 mL/min — AB (ref >90)
Glucose, Bld: 260 mg/dL — ABNORMAL HIGH (ref 70–99)
POTASSIUM: 5.7 meq/L — AB (ref 3.7–5.3)
SODIUM: 139 meq/L (ref 137–147)

## 2014-04-06 LAB — POTASSIUM: POTASSIUM: 5.3 meq/L (ref 3.7–5.3)

## 2014-04-06 LAB — PROTIME-INR
INR: 1.42 (ref 0.00–1.49)
Prothrombin Time: 17.5 seconds — ABNORMAL HIGH (ref 11.6–15.2)

## 2014-04-07 ENCOUNTER — Other Ambulatory Visit (HOSPITAL_COMMUNITY): Payer: Self-pay

## 2014-04-07 ENCOUNTER — Encounter: Payer: Self-pay | Admitting: Certified Registered"

## 2014-04-07 DIAGNOSIS — J690 Pneumonitis due to inhalation of food and vomit: Secondary | ICD-10-CM

## 2014-04-07 LAB — URINALYSIS, ROUTINE W REFLEX MICROSCOPIC
Bilirubin Urine: NEGATIVE
Glucose, UA: NEGATIVE mg/dL
Ketones, ur: NEGATIVE mg/dL
NITRITE: NEGATIVE
Protein, ur: 30 mg/dL — AB
SPECIFIC GRAVITY, URINE: 1.012 (ref 1.005–1.030)
UROBILINOGEN UA: 0.2 mg/dL (ref 0.0–1.0)
pH: 5 (ref 5.0–8.0)

## 2014-04-07 LAB — BLOOD GAS, ARTERIAL
ACID-BASE EXCESS: 8.6 mmol/L — AB (ref 0.0–2.0)
ACID-BASE EXCESS: 8.5 mmol/L — AB (ref 0.0–2.0)
ACID-BASE EXCESS: 6.7 mmol/L — AB (ref 0.0–2.0)
Acid-Base Excess: 8.3 mmol/L — ABNORMAL HIGH (ref 0.0–2.0)
BICARBONATE: 34.5 meq/L — AB (ref 20.0–24.0)
Bicarbonate: 36.7 mEq/L — ABNORMAL HIGH (ref 20.0–24.0)
Bicarbonate: 34.6 mEq/L — ABNORMAL HIGH (ref 20.0–24.0)
Bicarbonate: 32.5 mEq/L — ABNORMAL HIGH (ref 20.0–24.0)
Delivery systems: POSITIVE
Expiratory PAP: 10
FIO2: 0.28 %
FIO2: 0.35 %
FIO2: 0.35 %
INSPIRATORY PAP: 22
LHR: 36 {breaths}/min
MECHVT: 400 mL
O2 Content: 2 L/min
O2 Saturation: 92 %
O2 Saturation: 92 %
O2 Saturation: 98 %
O2 Saturation: 97.9 %
PATIENT TEMPERATURE: 98.6
PCO2 ART: 72.2 mmHg — AB (ref 35.0–45.0)
PEEP/CPAP: 5 cmH2O
PEEP: 5 cmH2O
PH ART: 7.31 — AB (ref 7.350–7.450)
PO2 ART: 65.4 mmHg — AB (ref 80.0–100.0)
PO2 ART: 89.3 mmHg (ref 80.0–100.0)
Patient temperature: 99.4
Patient temperature: 99.4
Patient temperature: 98.6
RATE: 15 resp/min
RATE: 18 resp/min
TCO2: 39.8 mmol/L (ref 0–100)
TCO2: 36.8 mmol/L (ref 0–100)
TCO2: 36.6 mmol/L (ref 0–100)
TCO2: 34.4 mmol/L (ref 0–100)
VT: 400 mL
pCO2 arterial: 104 mmHg (ref 35.0–45.0)
pCO2 arterial: 70.5 mmHg (ref 35.0–45.0)
pCO2 arterial: 63.1 mmHg (ref 35.0–45.0)
pH, Arterial: 7.177 — CL (ref 7.350–7.450)
pH, Arterial: 7.305 — ABNORMAL LOW (ref 7.350–7.450)
pH, Arterial: 7.332 — ABNORMAL LOW (ref 7.350–7.450)
pO2, Arterial: 59.7 mmHg — ABNORMAL LOW (ref 80.0–100.0)
pO2, Arterial: 99.5 mmHg (ref 80.0–100.0)

## 2014-04-07 LAB — BASIC METABOLIC PANEL
Anion gap: 6 (ref 5–15)
BUN: 40 mg/dL — ABNORMAL HIGH (ref 6–23)
CO2: 36 meq/L — AB (ref 19–32)
Calcium: 9.3 mg/dL (ref 8.4–10.5)
Chloride: 100 mEq/L (ref 96–112)
Creatinine, Ser: 2.34 mg/dL — ABNORMAL HIGH (ref 0.50–1.10)
GFR calc Af Amer: 23 mL/min — ABNORMAL LOW (ref >90)
GFR, EST NON AFRICAN AMERICAN: 20 mL/min — AB (ref >90)
GLUCOSE: 319 mg/dL — AB (ref 70–99)
POTASSIUM: 7.1 meq/L — AB (ref 3.7–5.3)
Sodium: 142 mEq/L (ref 137–147)

## 2014-04-07 LAB — CBC
HEMATOCRIT: 41.1 % (ref 36.0–46.0)
HEMOGLOBIN: 11.8 g/dL — AB (ref 12.0–15.0)
MCH: 30.6 pg (ref 26.0–34.0)
MCHC: 28.7 g/dL — ABNORMAL LOW (ref 30.0–36.0)
MCV: 106.8 fL — AB (ref 78.0–100.0)
PLATELETS: 355 10*3/uL (ref 150–400)
RBC: 3.85 MIL/uL — AB (ref 3.87–5.11)
RDW: 15.1 % (ref 11.5–15.5)
WBC: 14 10*3/uL — AB (ref 4.0–10.5)

## 2014-04-07 LAB — POTASSIUM: POTASSIUM: 6.4 meq/L — AB (ref 3.7–5.3)

## 2014-04-07 LAB — URINE MICROSCOPIC-ADD ON

## 2014-04-07 LAB — PROTIME-INR
INR: 2.06 — ABNORMAL HIGH (ref 0.00–1.49)
PROTHROMBIN TIME: 23.4 s — AB (ref 11.6–15.2)

## 2014-04-07 NOTE — Consult Note (Addendum)
PULMONARY / CRITICAL CARE MEDICINE   Name: Amanda Yang MRN: [id number] DOB: February 26, 1946    ADMISSION DATE:  04/02/2014 CONSULTATION DATE:  11/9  REFERRING MD :  [facility]  CHIEF COMPLAINT: SOB     SIGNIFICANT EVENTS: STUDIES/EVENTS: 10/15 - 10/23 - [name] -? For group B strep 03/21/14 - TX to [facility] [name] 03/22/14 - MRSA Trach aspirate + 03/27/14 - MOVE TO [place]  10/29 CT Ab/Pelvis > Free air from possible necrotic small bowel, cholilithiasis, small bilateral pleural effusions and bibasilar atelectasis  10/29 tx to [facility]  10/29 CCS consult: No indication for immediate surgery. CCS to to continue to follow clinically 10/31 Weaning on 14/5, 40%, benign abd exam  03/30/14: RT reports pt weaning on PSV 14/5 due to low volumes, tolerating well. No acute distress 03/31/14: CCM ruled out acute abdomen. Pall care consult: terminal wean ony if vegetatie. Full code +, Janina Mayo as option + 04/01/14 - EXTUBATED 11/3 CCS signed off 11/4 refuses NIMVS 11/4 transfer back to [name] 11/9 reintubated for hypercabia   HISTORY OF PRESENT ILLNESS:   68yo female with hx DM, AFib, AKI, HTN, baseline cognitive impairment initially admitted to [name] 10/15 with acute respiratory failure r/t PNA. Failed bipap, requiring intubation, rx with abx for Group B strep but failed attempt at extubation. Re-intubated and tx to [facility] 10/23 for weaning and ?trach. Course has been c/b AKI, metabolic encephalopathy, transaminitis. While in [facility] developed nausea and vomiting, Abd Xray revealed ?SBO and CT abd was done which was concerning for ischemic bowel and pt was tx to ER/[name] for admission and surgical evaluation on 10/29. She did not get surgery was extubated and sent back to [name] 11/4. She was re intubated 11/9 and PCCM again asked to consult.  PAST MEDICAL HISTORY :   has a past medical history of Hypertension; A-fib; Diabetes; Morbid obesity; Alopecia; and Transaminitis.  has no past  surgical history on file. Prior to Admission medications   Medication Sig Start Date End Date Taking? Authorizing Provider  Amino Acids-Protein Hydrolys (FEEDING SUPPLEMENT, PRO-STAT SUGAR FREE 64,) LIQD Place 30 mLs into feeding tube daily at 12 noon. 04/02/14   [name], NP  collagenase (SANTYL) ointment Apply topically daily. 04/02/14   [name], NP  digoxin (LANOXIN) 0.25 MG tablet Place 1 tablet (0.25 mg total) into feeding tube daily. 04/02/14   [name], NP  furosemide (LASIX) 10 MG/ML injection Inject 2 mLs (20 mg total) into the vein daily. 04/03/14   [name], NP  insulin aspart (NOVOLOG) 100 UNIT/ML injection Inject 0-20 Units into the skin every 4 (four) hours. 04/02/14   [name], NP  insulin glargine (LANTUS) 100 UNIT/ML injection Inject 0.05 mLs (5 Units total) into the skin at bedtime. 04/02/14   [name], NP  ipratropium-albuterol (DUONEB) 0.5-2.5 (3) MG/3ML SOLN Take 3 mLs by nebulization every 6 (six) hours. 04/02/14   [name], NP  Nutritional Supplements (FEEDING SUPPLEMENT, VITAL HIGH PROTEIN,) LIQD liquid Place 1,000 mLs into feeding tube daily. 04/02/14   [name], NP  pantoprazole (PROTONIX) 40 MG injection Inject 40 mg into the vein at bedtime. 04/02/14   [name], NP  potassium chloride 20 MEQ/15ML (10%) SOLN Place 30 mLs (40 mEq total) into feeding tube 2 (two) times daily. 04/02/14   [name], NP  sodium chloride 0.9 % infusion Inject 250 mLs into the vein as needed (if IV carrier fluid needed.).  04/02/14   [name], NP  sorbitol 70 % SOLN Take 30 mLs by mouth daily as needed for moderate constipation. 04/02/14   [name], NP  vancomycin (VANCOCIN) 1 GM/200ML SOLN Inject 200 mLs (1,000 mg total) into the vein every 12 (twelve) hours. 04/02/14   [name], NP   Allergies  Allergen Reactions  . Pollen Extract     FAMILY HISTORY:  has no family status information on file.  SOCIAL HISTORY:     REVIEW OF SYSTEMS:  NA  SUBJECTIVE:   VITAL SIGNS: Vital signs reviewed. Abnormal values will appear under impression plan section.  HEMODYNAMICS:   VENTILATOR SETTINGS:   INTAKE / OUTPUT: No intake or output data in the 24 hours ending 04/07/14 0900  PHYSICAL EXAMINATION: General: chronically ill in NAD, MO intubated on vent Neuro: sedated on vent HEENT: OETT, mm pink/moist, NGT in place ,  Lungs: Diminished throughout Abdomen: Obese, mildly distended, decreased BS but present, non tender or minimally tender, no rebound or guarding. NGT in place Ext: BUE edema, no LE edema LABS:  CBC  Recent Labs Lab 04/04/14 0530 04/05/14 0613 04/07/14 0416  WBC 12.6* 13.1* 14.0*  HGB 11.4* 11.5* 11.8*  HCT 37.8 37.4 41.1  PLT 405* 374 355   Coag's  Recent Labs Lab 04/04/14 0530 04/05/14 0613 04/06/14 0705  INR 1.27 1.19 1.42   BMET  Recent Labs Lab 04/04/14 0530 04/05/14 0613 04/06/14 0705 04/07/14 0416  NA 140 139  --  142  K 5.6* 5.7* 5.3 7.1*  CL 99 99  --  100  CO2 31 28  --  36*  BUN 23 24*  --  40*  CREATININE 2.11* 2.03*  --  2.34*  GLUCOSE 374* 260*  --  319*   Electrolytes  Recent Labs Lab 04/01/14 0439  04/02/14 0253  04/04/14 0530 04/05/14 0613 04/07/14 0416  CALCIUM 8.6  < > 8.9  < > 8.7 9.1 9.3  MG 2.1  --  1.8  --   --   --   --   PHOS 2.8  --  3.6  --   --   --   --   < > = values in this interval not displayed. Sepsis Markers  Recent Labs Lab 04/03/14 0630  PROCALCITON 0.21   ABG  Recent Labs Lab 04/07/14 0407 04/07/14 0543 04/07/14 0800  PHART 7.177* 7.305* 7.310*  PCO2ART 104.0* 72.2* 70.5*  PO2ART 65.4* 59.7* 89.3   Liver Enzymes  Recent Labs Lab 04/03/14 0630  AST 27  ALT 42*  ALKPHOS 68  BILITOT 0.5  ALBUMIN 2.4*   Cardiac Enzymes  Recent Labs Lab 04/01/14 0439  PROBNP 909.6*   Glucose  Recent Labs Lab 04/01/14 1924 04/01/14 2326 04/02/14 0346 04/02/14 0821 04/02/14 1215 04/02/14 1551   GLUCAP 134* 108* 133* 120* 130* 133*    Imaging No results found.    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 Acute on chronic respiratory failure requiring prolonged ventilation              - hypercarbic resp failure 11/9 - due to GRoup B strep CAP followed by MRSA PNA   [facility] and re int[facility]ated 11/9  P:  Wean as tolerated Pursue early trach due to MO/OHS  Brett CanalesSteve Minor ACNP [facility] P[facility] Pager [phone] till 3 pm If no answer page [phone] 04/07/2014, 9:05 AM  Patient reintubated, venting well, concern here is that patient will continue to have respiratory failure and intubations as historically evident.  Will need to speak with family, if a trach is desired then will trach later this week.  If they do not desire trach/peg then will wean, extubate and make DNR as I do not foresee patient extubating successfully.  Continue abx for group B strep.  CC time 40 min.  Patient seen and examined, agree with above note.  I dictated the care and orders written for this patient under my direction.  Alyson ReedyWesam G Daymein Nunnery, MD [phone]

## 2014-04-08 ENCOUNTER — Other Ambulatory Visit (HOSPITAL_COMMUNITY): Payer: Self-pay

## 2014-04-08 LAB — BASIC METABOLIC PANEL
ANION GAP: 12 (ref 5–15)
BUN: 53 mg/dL — AB (ref 6–23)
CHLORIDE: 103 meq/L (ref 96–112)
CO2: 32 mEq/L (ref 19–32)
CREATININE: 2.61 mg/dL — AB (ref 0.50–1.10)
Calcium: 9 mg/dL (ref 8.4–10.5)
GFR, EST AFRICAN AMERICAN: 20 mL/min — AB (ref >90)
GFR, EST NON AFRICAN AMERICAN: 18 mL/min — AB (ref >90)
Glucose, Bld: 175 mg/dL — ABNORMAL HIGH (ref 70–99)
POTASSIUM: 5.1 meq/L (ref 3.7–5.3)
Sodium: 147 mEq/L (ref 137–147)

## 2014-04-08 LAB — CBC
HEMATOCRIT: 37.2 % (ref 36.0–46.0)
Hemoglobin: 10.9 g/dL — ABNORMAL LOW (ref 12.0–15.0)
MCH: 31.1 pg (ref 26.0–34.0)
MCHC: 29.3 g/dL — AB (ref 30.0–36.0)
MCV: 106.3 fL — ABNORMAL HIGH (ref 78.0–100.0)
PLATELETS: 256 10*3/uL (ref 150–400)
RBC: 3.5 MIL/uL — ABNORMAL LOW (ref 3.87–5.11)
RDW: 15.5 % (ref 11.5–15.5)
WBC: 11 10*3/uL — ABNORMAL HIGH (ref 4.0–10.5)

## 2014-04-08 LAB — VANCOMYCIN, RANDOM: Vancomycin Rm: 10.3 ug/mL

## 2014-04-08 LAB — PROTIME-INR
INR: 1.92 — ABNORMAL HIGH (ref 0.00–1.49)
Prothrombin Time: 22.1 seconds — ABNORMAL HIGH (ref 11.6–15.2)

## 2014-04-08 LAB — ABO/RH: ABO/RH(D): O NEG

## 2014-04-08 NOTE — Progress Notes (Signed)
Pulmonary rounds Multi-disciplinary rounds complete.   Plan reviewed.  For trach 11/11 Cont weaning efforts.   Anders SimmondsPete Jamieka Royle ACNP-BC [facility]/Critical [facility] Pager # [phone] OR # [phone] if no answer

## 2014-04-09 ENCOUNTER — Other Ambulatory Visit (HOSPITAL_COMMUNITY): Payer: Self-pay

## 2014-04-09 LAB — CBC
HEMATOCRIT: 29.4 % — AB (ref 36.0–46.0)
Hemoglobin: 9.1 g/dL — ABNORMAL LOW (ref 12.0–15.0)
MCH: 31.3 pg (ref 26.0–34.0)
MCHC: 31 g/dL (ref 30.0–36.0)
MCV: 101 fL — ABNORMAL HIGH (ref 78.0–100.0)
Platelets: 211 10*3/uL (ref 150–400)
RBC: 2.91 MIL/uL — ABNORMAL LOW (ref 3.87–5.11)
RDW: 15.2 % (ref 11.5–15.5)
WBC: 8.2 10*3/uL (ref 4.0–10.5)

## 2014-04-09 LAB — PROTIME-INR
INR: 1.43 (ref 0.00–1.49)
PROTHROMBIN TIME: 17.6 s — AB (ref 11.6–15.2)

## 2014-04-09 LAB — URINE CULTURE: Colony Count: >=100000

## 2014-04-09 LAB — BASIC METABOLIC PANEL
Anion gap: 11 (ref 5–15)
BUN: 53 mg/dL — ABNORMAL HIGH (ref 6–23)
CO2: 33 mEq/L — ABNORMAL HIGH (ref 19–32)
CREATININE: 2.44 mg/dL — AB (ref 0.50–1.10)
Calcium: 8.4 mg/dL (ref 8.4–10.5)
Chloride: 98 mEq/L (ref 96–112)
GFR, EST AFRICAN AMERICAN: 22 mL/min — AB (ref >90)
GFR, EST NON AFRICAN AMERICAN: 19 mL/min — AB (ref >90)
Glucose, Bld: 279 mg/dL — ABNORMAL HIGH (ref 70–99)
Potassium: 3.3 mEq/L — ABNORMAL LOW (ref 3.7–5.3)
Sodium: 142 mEq/L (ref 137–147)

## 2014-04-09 LAB — APTT: aPTT: 38 seconds — ABNORMAL HIGH (ref 24–37)

## 2014-04-10 ENCOUNTER — Other Ambulatory Visit (HOSPITAL_COMMUNITY): Payer: Self-pay

## 2014-04-10 ENCOUNTER — Encounter (HOSPITAL_COMMUNITY): Payer: Self-pay

## 2014-04-10 DIAGNOSIS — J9620 Acute and chronic respiratory failure, unspecified whether with hypoxia or hypercapnia: Secondary | ICD-10-CM

## 2014-04-10 DIAGNOSIS — J811 Chronic pulmonary edema: Secondary | ICD-10-CM

## 2014-04-10 DIAGNOSIS — J9601 Acute respiratory failure with hypoxia: Secondary | ICD-10-CM

## 2014-04-10 DIAGNOSIS — Z930 Tracheostomy status: Secondary | ICD-10-CM

## 2014-04-10 LAB — BASIC METABOLIC PANEL
Anion gap: 10 (ref 5–15)
BUN: 41 mg/dL — AB (ref 6–23)
CALCIUM: 8.4 mg/dL (ref 8.4–10.5)
CO2: 31 mEq/L (ref 19–32)
Chloride: 102 mEq/L (ref 96–112)
Creatinine, Ser: 2.05 mg/dL — ABNORMAL HIGH (ref 0.50–1.10)
GFR calc non Af Amer: 24 mL/min — ABNORMAL LOW (ref >90)
GFR, EST AFRICAN AMERICAN: 27 mL/min — AB (ref >90)
GLUCOSE: 206 mg/dL — AB (ref 70–99)
POTASSIUM: 3.2 meq/L — AB (ref 3.7–5.3)
SODIUM: 143 meq/L (ref 137–147)

## 2014-04-10 LAB — PREPARE FRESH FROZEN PLASMA
UNIT DIVISION: 0
Unit division: 0

## 2014-04-10 LAB — CBC
HEMATOCRIT: 29.9 % — AB (ref 36.0–46.0)
HEMOGLOBIN: 9.1 g/dL — AB (ref 12.0–15.0)
MCH: 31.2 pg (ref 26.0–34.0)
MCHC: 30.4 g/dL (ref 30.0–36.0)
MCV: 102.4 fL — ABNORMAL HIGH (ref 78.0–100.0)
Platelets: 181 10*3/uL (ref 150–400)
RBC: 2.92 MIL/uL — ABNORMAL LOW (ref 3.87–5.11)
RDW: 15.3 % (ref 11.5–15.5)
WBC: 7.5 10*3/uL (ref 4.0–10.5)

## 2014-04-10 LAB — PROTIME-INR
INR: 1.21 (ref 0.00–1.49)
Prothrombin Time: 15.4 seconds — ABNORMAL HIGH (ref 11.6–15.2)

## 2014-04-10 LAB — CULTURE, RESPIRATORY W GRAM STAIN

## 2014-04-10 LAB — CULTURE, RESPIRATORY

## 2014-04-10 LAB — DIGOXIN LEVEL: DIGOXIN LVL: 2.2 ng/mL — AB (ref 0.8–2.0)

## 2014-04-10 NOTE — Procedures (Signed)
Percutaneous Tracheostomy Placement  Consent from family, patient sedated, paralyzed and positioned.  Area cleaned, lidocaine/epi injected, skin incision done followed by blunt dissection, airway entered, wire placed and visualized bronchoscopically, airway crushed and dilated, tracheostomy placed, size 6 shiley, bronchoscope placed through the tracheostomy and visualized well above carina.  CXR ordered and pending.  Patient tolerated the procedure well.  Alyson ReedyWesam G. Yacoub, M.D. [facility]/Critical [facility] Medicine. Pager: [phone]. After hours pager: [phone].

## 2014-04-10 NOTE — Progress Notes (Signed)
PULMONARY / CRITICAL CARE MEDICINE   Name: Amanda Yang MRN: [id number] DOB: 1945-10-10    ADMISSION DATE:  04/02/2014 CONSULTATION DATE:  11/9  [facility]  CHIEF COMPLAINT: SOB  Brief hx: 68 y/o female initially admitted to [facility] 10/23 with ETT for vent wean / ?trach in setting PNA, Afib RVR. Developed worsening AMS and n/v and CT abd concerning for necrotic bowel. [facility]/ [facility].  She did not require surgery, was extubated and [facility] She was re intubated 11/9 and PCCM again asked to consult.  STUDIES/EVENTS: 10/15 - 10/23 - [name] -? For group B strep 03/21/14 - TX to [facility] at [name] 03/22/14 - MRSA Trach aspirate + 03/27/14 - MOVE TO [place]  10/29 CT Ab/Pelvis > Free air from possible necrotic small bowel, cholilithiasis, small bilateral pleural effusions and bibasilar atelectasis  10/29 [facility]  10/29 CCS consult: No indication for immediate surgery. CCS to to continue to follow clinically 10/31 Weaning on 14/5, 40%, benign abd exam  03/30/14: RT reports pt weaning on PSV 14/5 due to low volumes, tolerating well. No acute distress 03/31/14: CCM ruled out acute abdomen. Pall care consult: terminal wean ony if vegetatie. Full code +, Trach as option + 04/01/14 - EXTUBATED 11/3 CCS signed off 11/4 refuses NIMVS 11/4 [facility] 11/9 reintubated for hypercabia   SUBJECTIVE/ INTERVAL Hx:  No sig change.  For bedside trach today.   VITAL SIGNS: Vital signs reviewed. Abnormal values will appear under impression plan section.    INTAKE / OUTPUT: No intake or output data in the 24 hours ending 04/10/14 1220  PHYSICAL EXAMINATION: General: chronically ill in NAD, MO intubated on vent Neuro: sedated on vent HEENT: OETT, mm pink/moist, NGT in place ,  Lungs: Diminished throughout Abdomen: Obese, mildly distended, decreased BS but present, non tender or minimally tender, no rebound or  guarding. NGT in place Ext: BUE edema, no LE edema  LABS:  CBC  Recent Labs Lab 04/07/14 0416 04/08/14 0830 04/09/14 0700  WBC 14.0* 11.0* 8.2  HGB 11.8* 10.9* 9.1*  HCT 41.1 37.2 29.4*  PLT 355 256 211   Coag's  Recent Labs Lab 04/08/14 0830 04/09/14 0700 04/10/14 0500  APTT  --  38*  --   INR 1.92* 1.43 1.21   BMET  Recent Labs Lab 04/07/14 0416 04/07/14 1340 04/08/14 0830 04/09/14 0700  NA 142  --  147 142  K 7.1* 6.4* 5.1 3.3*  CL 100  --  103 98  CO2 36*  --  32 33*  BUN 40*  --  53* 53*  CREATININE 2.34*  --  2.61* 2.44*  GLUCOSE 319*  --  175* 279*   Electrolytes  Recent Labs Lab 04/07/14 0416 04/08/14 0830 04/09/14 0700  CALCIUM 9.3 9.0 8.4   Sepsis Markers No results for input(s): LATICACIDVEN, PROCALCITON, O2SATVEN in the last 168 hours. ABG  Recent Labs Lab 04/07/14 0543 04/07/14 0800 04/07/14 0920  PHART 7.305* 7.310* 7.332*  PCO2ART 72.2* 70.5* 63.1*  PO2ART 59.7* 89.3 99.5   Liver Enzymes No results for input(s): AST, ALT, ALKPHOS, BILITOT, ALBUMIN in the last 168 hours. Cardiac Enzymes No results for input(s): TROPONINI, PROBNP in the last 168 hours. Glucose No results for input(s): GLUCAP in the last 168 hours.  Imaging Dg Chest Port 1 View  04/09/2014   CLINICAL DATA:  Respiratory failure. Ventilator dependence. Atrial fibrillation. Diabetes.  EXAM: PORTABLE CHEST - 1 VIEW  COMPARISON:  04/08/2014  FINDINGS: Endotracheal tube is 1.7 cm above the carina. Consider retracting 1 cm.  A feeding tube is indistinct distally but is felt to enter the stomach. Left sided central line tip projects over the lower SVC.  Indistinct pulmonary vasculature with bilateral interstitial accentuation noted. Mildly enlarged cardiopericardial silhouette.  IMPRESSION: 1. Mildly enlarged cardiopericardial silhouette with indistinct pulmonary vasculature and interstitial accentuation potentially reflecting interstitial edema. 2. The endotracheal tube  tip is 1.7 cm above the carina. Although this is not malpositioned, in order to maintain a margin of safety, consider retracting 1 cm.   Electronically Signed   By: Herbie BaltimoreWalt  Liebkemann M.D.   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   P:  Hold weaning until trach. Proceed with tracheostomy today. Abx per primary. Keep as dry as able.  Dirk DressKaty Whiteheart, NP 04/10/2014  12:20 PM Pager: [phone] or [phone]  Will place tracheostomy today, proceed with weaning and keep as dry as able to.  Patient seen and examined, agree with above note.  I dictated the care and orders written for this patient under my direction.  Alyson ReedyWesam G Yacoub, MD [phone]

## 2014-04-10 NOTE — Procedures (Signed)
PCCM Bronchoscopy Procedure Note  The patient was informed of the risks (including but not limited to bleeding, infection, respiratory failure, lung injury, tooth/oral injury) and benefits of the procedure and gave consent, see chart.  Indication: Acute respiratory failure with hypoxemia; visualization for percutaneous tracheostomy   [facility] [place]  Condition pre procedure: stable on vent  Medications for procedure: propofol gtt, fentanyl gtt  Procedure description: The bronchoscope was introduced through the endotracheal tube and passed to the bilateral lungs to the level of the maintem bronchi.  Airway exam revealed normal airway mucosa, no airway lesions.  The scope was used to guide the percutaneous tracheostomy procedure.  Procedures performed: none  Specimens sent: none  Condition post procedure: stable, s/p tracheostomy  EBL: per Dr. Molli KnockYacoub note  Complications: none  Procedure performed by Anders SimmondsPete Babcock NP under my direct supervision.  Heber CarolinaBrent Reyanne Hussar, MD [facility] P[facility]M Pager: [phone] Cell: [phone] If no response, call [phone]

## 2014-04-10 NOTE — Progress Notes (Deleted)
PULMONARY / CRITICAL CARE MEDICINE   Name: Amanda CharonMartha J Yang MRN: [id number] DOB: Dec 02, 1945    ADMISSION DATE:  04/02/2014 CONSULTATION DATE:  11/9  [facility]  [facility]  CHIEF COMPLAINT: SOB  Brief hx: 68 y/o female initially admitted [facility] 10/[facility] with ETT for vent wean / ?trach in setting PNA, Afib RVR. Developed worsening AMS and n/v and CT abd concerning for necrotic bowel. [facility]/ [facility].  She did not require surgery, was extubated and [facility]. She was re intubated 11/9 and PCCM again asked to consult.  STUDIES/EVENTS: 10/15 - 10/23 - [name] -? For group B strep 03/21/14 - TX to [facility] at [name] 03/22/14 - MRSA Trach aspirate + 03/27/14 - MOVE TO [place]  10/29 CT Ab/Pelvis > Free air from possible necrotic small bowel, cholilithiasis, small bilateral pleural effusions and bibasilar atelectasis  [facility]  10/29 CCS consult: No indication for immediate surgery. CCS to to continue to follow clinically 10/31 Weaning on 14/5, 40%, benign abd exam  03/30/14: RT reports pt weaning on PSV 14/5 due to low volumes, tolerating well. No acute distress 03/31/14: CCM ruled out acute abdomen. Pall care consult: terminal wean ony if vegetatie. Full code +, Trach as option + 04/01/14 - EXTUBATED 11/3 CCS signed off 11/4 refuses NIMVS 11/[facility]9 reintubated for hypercabia   SUBJECTIVE/ INTERVAL Hx:  No sig change.  For bedside trach today.   VITAL SIGNS: Vital signs reviewed. Abnormal values will appear under impression plan section.    INTAKE / OUTPUT: No intake or output data in the 24 hours ending 04/10/14 1121  PHYSICAL EXAMINATION: General: chronically ill in NAD, MO intubated on vent Neuro: sedated on vent HEENT: OETT, mm pink/moist, NGT in place ,  Lungs: Diminished throughout Abdomen: Obese, mildly distended, decreased BS but present, non tender or minimally tender, no rebound or  guarding. NGT in place Ext: BUE edema, no LE edema  LABS:  CBC  Recent Labs Lab 04/07/14 0416 04/08/14 0830 04/09/14 0700  WBC 14.0* 11.0* 8.2  HGB 11.8* 10.9* 9.1*  HCT 41.1 37.2 29.4*  PLT 355 256 211   Coag's  Recent Labs Lab 04/08/14 0830 04/09/14 0700 04/10/14 0500  APTT  --  38*  --   INR 1.92* 1.43 1.21   BMET  Recent Labs Lab 04/07/14 0416 04/07/14 1340 04/08/14 0830 04/09/14 0700  NA 142  --  147 142  K 7.1* 6.4* 5.1 3.3*  CL 100  --  103 98  CO2 36*  --  32 33*  BUN 40*  --  53* 53*  CREATININE 2.34*  --  2.61* 2.44*  GLUCOSE 319*  --  175* 279*   Electrolytes  Recent Labs Lab 04/07/14 0416 04/08/14 0830 04/09/14 0700  CALCIUM 9.3 9.0 8.4   Sepsis Markers No results for input(s): LATICACIDVEN, PROCALCITON, O2SATVEN in the last 168 hours. ABG  Recent Labs Lab 04/07/14 0543 04/07/14 0800 04/07/14 0920  PHART 7.305* 7.310* 7.332*  PCO2ART 72.2* 70.5* 63.1*  PO2ART 59.7* 89.3 99.5   Liver Enzymes No results for input(s): AST, ALT, ALKPHOS, BILITOT, ALBUMIN in the last 168 hours. Cardiac Enzymes No results for input(s): TROPONINI, PROBNP in the last 168 hours. Glucose No results for input(s): GLUCAP in the last 168 hours.  Imaging Dg Chest Port 1 View  04/09/2014   CLINICAL DATA:  Respiratory failure. Ventilator dependence. Atrial fibrillation. Diabetes.  EXAM: PORTABLE CHEST - 1 VIEW  COMPARISON:  04/08/2014  FINDINGS: Endotracheal tube is 1.7 cm above the carina. Consider retracting 1 cm.  A feeding tube is indistinct distally but is felt to enter the stomach. Left sided central line tip projects over the lower SVC.  Indistinct pulmonary vasculature with bilateral interstitial accentuation noted. Mildly enlarged cardiopericardial silhouette.  IMPRESSION: 1. Mildly enlarged cardiopericardial silhouette with indistinct pulmonary vasculature and interstitial accentuation potentially reflecting interstitial edema. 2. The endotracheal tube  tip is 1.7 cm above the carina. Although this is not malpositioned, in order to maintain a margin of safety, consider retracting 1 cm.   Electronically Signed   By: Herbie BaltimoreWalt  Liebkemann M.D.   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   P:  Wean as tolerated. Abx per primary. Tracheostomy placement. Keep as dry as able.  Dirk DressKaty Whiteheart, NP 04/10/2014  11:21 AM Pager: [phone] or [phone]  Will place tracheostomy today, proceed with weaning and keep as dry as able to.  Patient seen and examined, agree with above note.  I dictated the care and orders written for this patient under my direction.  Alyson ReedyWesam G Yacoub, MD [phone]

## 2014-04-10 NOTE — Procedures (Signed)
Bedside Tracheostomy Insertion Procedure Note   Patient Details:   Name: Amanda Yang DOB: Feb 13, 1946 MRN: [id number]  Procedure: Tracheostomy  Pre Procedure Assessment: ET Tube Size:7.5 ET Tube secured at lip (cm):22 Bite block in place: No Breath Sounds: Clear  Post Procedure Assessment: There were no vitals taken for this visit. O2 sats: stable throughout Complications: No apparent complications Patient did tolerate procedure well Tracheostomy Brand:Shiley Tracheostomy Style:Cuffed Tracheostomy Size: 6 Tracheostomy Secured WUJ:[id number]:Sutures Tracheostomy Placement Confirmation:Trach cuff visualized and in place    Cherylin MylarDoyle, Aubriana Ravelo 04/10/2014, 1:20 PM

## 2014-04-11 LAB — DIGOXIN LEVEL: Digoxin Level: 1.3 ng/mL (ref 0.8–2.0)

## 2014-04-11 LAB — PROTIME-INR
INR: 1.23 (ref 0.00–1.49)
Prothrombin Time: 15.6 seconds — ABNORMAL HIGH (ref 11.6–15.2)

## 2014-04-11 LAB — VANCOMYCIN, TROUGH
VANCOMYCIN TR: 28.3 ug/mL — AB (ref 10.0–20.0)
Vancomycin Tr: 32.3 ug/mL (ref 10.0–20.0)

## 2014-04-12 ENCOUNTER — Other Ambulatory Visit (HOSPITAL_COMMUNITY): Payer: Self-pay

## 2014-04-12 LAB — BASIC METABOLIC PANEL
ANION GAP: 11 (ref 5–15)
BUN: 54 mg/dL — ABNORMAL HIGH (ref 6–23)
CHLORIDE: 100 meq/L (ref 96–112)
CO2: 29 mEq/L (ref 19–32)
Calcium: 9 mg/dL (ref 8.4–10.5)
Creatinine, Ser: 1.97 mg/dL — ABNORMAL HIGH (ref 0.50–1.10)
GFR, EST AFRICAN AMERICAN: 29 mL/min — AB (ref >90)
GFR, EST NON AFRICAN AMERICAN: 25 mL/min — AB (ref >90)
Glucose, Bld: 232 mg/dL — ABNORMAL HIGH (ref 70–99)
POTASSIUM: 3.6 meq/L — AB (ref 3.7–5.3)
SODIUM: 140 meq/L (ref 137–147)

## 2014-04-12 LAB — CBC
HCT: 29.6 % — ABNORMAL LOW (ref 36.0–46.0)
HEMOGLOBIN: 9.1 g/dL — AB (ref 12.0–15.0)
MCH: 31.7 pg (ref 26.0–34.0)
MCHC: 30.7 g/dL (ref 30.0–36.0)
MCV: 103.1 fL — ABNORMAL HIGH (ref 78.0–100.0)
PLATELETS: 195 10*3/uL (ref 150–400)
RBC: 2.87 MIL/uL — AB (ref 3.87–5.11)
RDW: 15.8 % — ABNORMAL HIGH (ref 11.5–15.5)
WBC: 9.8 10*3/uL (ref 4.0–10.5)

## 2014-04-12 LAB — PROTIME-INR
INR: 1.62 — AB (ref 0.00–1.49)
PROTHROMBIN TIME: 19.4 s — AB (ref 11.6–15.2)

## 2014-04-12 LAB — VANCOMYCIN, RANDOM: Vancomycin Rm: 21.6 ug/mL

## 2014-04-13 LAB — CULTURE, BLOOD (SINGLE)
Culture: NO GROWTH
Culture: NO GROWTH

## 2014-04-13 LAB — VANCOMYCIN, TROUGH: Vancomycin Tr: 17.7 ug/mL (ref 10.0–20.0)

## 2014-04-13 LAB — PROTIME-INR
INR: 2.08 — AB (ref 0.00–1.49)
PROTHROMBIN TIME: 23.6 s — AB (ref 11.6–15.2)

## 2014-04-14 ENCOUNTER — Other Ambulatory Visit (HOSPITAL_COMMUNITY): Payer: Self-pay

## 2014-04-14 LAB — CBC
HCT: 29.7 % — ABNORMAL LOW (ref 36.0–46.0)
Hemoglobin: 9.2 g/dL — ABNORMAL LOW (ref 12.0–15.0)
MCH: 31.5 pg (ref 26.0–34.0)
MCHC: 31 g/dL (ref 30.0–36.0)
MCV: 101.7 fL — ABNORMAL HIGH (ref 78.0–100.0)
Platelets: 242 10*3/uL (ref 150–400)
RBC: 2.92 MIL/uL — ABNORMAL LOW (ref 3.87–5.11)
RDW: 16.1 % — AB (ref 11.5–15.5)
WBC: 10.7 10*3/uL — ABNORMAL HIGH (ref 4.0–10.5)

## 2014-04-14 LAB — BASIC METABOLIC PANEL
Anion gap: 10 (ref 5–15)
BUN: 53 mg/dL — ABNORMAL HIGH (ref 6–23)
CALCIUM: 9.3 mg/dL (ref 8.4–10.5)
CO2: 30 mEq/L (ref 19–32)
Chloride: 103 mEq/L (ref 96–112)
Creatinine, Ser: 1.83 mg/dL — ABNORMAL HIGH (ref 0.50–1.10)
GFR, EST AFRICAN AMERICAN: 31 mL/min — AB (ref >90)
GFR, EST NON AFRICAN AMERICAN: 27 mL/min — AB (ref >90)
GLUCOSE: 274 mg/dL — AB (ref 70–99)
Potassium: 3.9 mEq/L (ref 3.7–5.3)
SODIUM: 143 meq/L (ref 137–147)

## 2014-04-14 LAB — PROTIME-INR
INR: 2.31 — AB (ref 0.00–1.49)
PROTHROMBIN TIME: 25.6 s — AB (ref 11.6–15.2)

## 2014-04-14 NOTE — Progress Notes (Signed)
PULMONARY / CRITICAL CARE MEDICINE   Name: Amanda Yang MRN: [id number] DOB: Nov 17, 1945    ADMISSION DATE:  04/02/2014 CONSULTATION DATE:  11/9  [facility] :  [facility]  CHIEF COMPLAINT: SOB  Brief hx: 68 y/o female initially admitted to [facility] 10/23 with ETT for vent wean / ?trach in setting PNA, Afib RVR. Developed worsening AMS and n/v and CT abd concerning for necrotic bowel. [facility].  She did not require surgery, [facility] She was re intubated 11/9 and PCCM again asked to consult.  STUDIES/EVENTS: 10/15 - 10/23 - [name] -? For group B strep 03/21/14 - TX to [facility] at [name] 03/22/14 - MRSA Trach aspirate + 03/27/14 - MOVE TO [place]  10/29 CT Ab/Pelvis > Free air from possible necrotic small bowel, cholilithiasis, small bilateral pleural effusions and bibasilar atelectasis  10/29 tx to [facility] from [facility]  10/29 [facility] consult: No indication for immediate surgery. CCS to to continue to follow clinically 10/31 Weaning on 14/5, 40%, benign abd exam  03/30/14: RT reports pt weaning on PSV 14/5 due to low volumes, tolerating well. No acute distress 03/31/14: CCM ruled out acute abdomen. Pall care consult: terminal wean ony if vegetatie. Full code +, Trach as option + 04/01/14 - EXTUBATED 11/3 CCS signed off 11/4 refuses NIMVS 11[facility] reintubated for hypercabia 11/12 trached per JY  SUBJECTIVE/ INTERVAL Hx:  More awake  VITAL SIGNS: Vital signs reviewed. Abnormal values will appear under impression plan section.    INTAKE / OUTPUT: No intake or output data in the 24 hours ending 04/14/14 0923  PHYSICAL EXAMINATION: General: chronically ill in NAD, MO trached, PS 12-> Vt 600-800 cc/rr 18, alopecia noted Neuro: Awake and interactive HEENT: trach, mm pink/moist, NGT in place ,  Lungs: Diminished throughout Abdomen: Obese, mildly distended, decreased BS but present, non tender or  minimally tender, no rebound or guarding. NGT in place with TF running Ext: BUE edema, no LE edema  LABS:  CBC  Recent Labs Lab 04/10/14 1400 04/12/14 0530 04/14/14 0500  WBC 7.5 9.8 10.7*  HGB 9.1* 9.1* 9.2*  HCT 29.9* 29.6* 29.7*  PLT 181 195 242   Coag's  Recent Labs Lab 04/09/14 0700  04/12/14 0530 04/13/14 0654 04/14/14 0500  APTT 38*  --   --   --   --   INR 1.43  < > 1.62* 2.08* 2.31*  < > = values in this interval not displayed. BMET  Recent Labs Lab 04/10/14 1400 04/12/14 0530 04/14/14 0500  NA 143 140 143  K 3.2* 3.6* 3.9  CL 102 100 103  CO2 31 29 30   BUN 41* 54* 53*  CREATININE 2.05* 1.97* 1.83*  GLUCOSE 206* 232* 274*   Electrolytes  Recent Labs Lab 04/10/14 1400 04/12/14 0530 04/14/14 0500  CALCIUM 8.4 9.0 9.3   Sepsis Markers No results for input(s): LATICACIDVEN, PROCALCITON, O2SATVEN in the last 168 hours. ABG No results for input(s): PHART, PCO2ART, PO2ART in the last 168 hours. Liver Enzymes No results for input(s): AST, ALT, ALKPHOS, BILITOT, ALBUMIN in the last 168 hours. Cardiac Enzymes No results for input(s): TROPONINI, PROBNP in the last 168 hours. Glucose No results for input(s): GLUCAP in the last 168 hours.  Imaging No results found.   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   P:  Wean per protocol now that she is trached Abx per primary. Keep as dry as able.  Brett CanalesSteve Minor ACNP [facility] Pager [phone] till 3 pm If no answer page [phone] 04/14/2014, 9:23 AM  Trach in place, continue weaning efforts per protocol, will need active diureses, treat PNA, agree with abx choices.  Will continue to follow with you.  Patient seen and examined, agree with above note.   I dictated the care and orders written for this patient under my direction.  Amanda ReedyWesam G Yang Catlin, MD [phone]

## 2014-04-15 DIAGNOSIS — E46 Unspecified protein-calorie malnutrition: Secondary | ICD-10-CM

## 2014-04-15 LAB — PROTIME-INR
INR: 1.62 — ABNORMAL HIGH (ref 0.00–1.49)
Prothrombin Time: 19.4 seconds — ABNORMAL HIGH (ref 11.6–15.2)

## 2014-04-15 NOTE — Consult Note (Signed)
Chief Complaint: Protein calorie malnutrition Need for long term care  Referring Physician(s): [facility]  History of Present Illness: Amanda Yang is a 68 y.o. female  Pt with Hx hospital acquired PNA Ventilated Developed ischemic/necrotic bowel ---did not require surgery per [facility] Transferred back to [facility] but required intubation again then trach placement Encephalopathy PCM Need for long term care  Request fro IR consult for percutaneous gastric tube placement Dr Lowella Dandy has reviewed imaging Feels anatomy is appropriate for procedure I have seen and examined pt Now scheduled for G tube when INR is wnl Hx afib---was on coumadin--now held (1.62 today)  Past Medical History  Diagnosis Date  . Hypertension   . A-fib   . Diabetes   . Morbid obesity   . Alopecia   . Transaminitis     No past surgical history on file.  Allergies: Pollen extract  Medications: Prior to Admission medications   Medication Sig Start Date End Date Taking? Authorizing Provider  Amino Acids-Protein Hydrolys (FEEDING SUPPLEMENT, PRO-STAT SUGAR FREE 64,) LIQD Place 30 mLs into feeding tube daily at 12 noon. 04/02/14   [name], NP  collagenase (SANTYL) ointment Apply topically daily. 04/02/14   [name], NP  digoxin (LANOXIN) 0.25 MG tablet Place 1 tablet (0.25 mg total) into feeding tube daily. 04/02/14   [name], NP  furosemide (LASIX) 10 MG/ML injection Inject 2 mLs (20 mg total) into the vein daily. 04/03/14   [name], NP  insulin aspart (NOVOLOG) 100 UNIT/ML injection Inject 0-20 Units into the skin every 4 (four) hours. 04/02/14   [name], NP  insulin glargine (LANTUS) 100 UNIT/ML injection Inject 0.05 mLs (5 Units total) into the skin at bedtime. 04/02/14   [name], NP  ipratropium-albuterol (DUONEB) 0.5-2.5 (3) MG/3ML SOLN Take 3 mLs by nebulization every 6 (six) hours. 04/02/14   [name], NP  Nutritional Supplements (FEEDING SUPPLEMENT, VITAL  HIGH PROTEIN,) LIQD liquid Place 1,000 mLs into feeding tube daily. 04/02/14   [name], NP  pantoprazole (PROTONIX) 40 MG injection Inject 40 mg into the vein at bedtime. 04/02/14   [name], NP  potassium chloride 20 MEQ/15ML (10%) SOLN Place 30 mLs (40 mEq total) into feeding tube 2 (two) times daily. 04/02/14   [name], NP  sodium chloride 0.9 % infusion Inject 250 mLs into the vein as needed (if IV carrier fluid needed.). 04/02/14   [name], NP  sorbitol 70 % SOLN Take 30 mLs by mouth daily as needed for moderate constipation. 04/02/14   [name], NP  vancomycin (VANCOCIN) 1 GM/200ML SOLN Inject 200 mLs (1,000 mg total) into the vein every 12 (twelve) hours. 04/02/14   [name], NP    No family history on file.  History   Social History  . Marital Status: Divorced    Spouse Name: N/A    Number of Children: N/A  . Years of Education: N/A   Social History Main Topics  . Smoking status: Not on file  . Smokeless tobacco: Not on file  . Alcohol Use: Not on file  . Drug Use: Not on file  . Sexual Activity: Not on file   Other Topics Concern  . Not on file   Social History Narrative  . No narrative on file    Review of Systems: A 12 point ROS discussed and pertinent positives are indicated in the HPI above.  All other systems  are negative.  Review of Systems  Psychiatric/Behavioral: Positive for agitation.    Vital Signs: There were no vitals taken for this visit.  Physical Exam  Cardiovascular: Normal rate.   No murmur heard. Pulmonary/Chest: Effort normal. Amanda Yang has wheezes.  Abdominal: Soft. Bowel sounds are normal. Amanda Yang exhibits distension.  Musculoskeletal: Normal range of motion.  Neurological:  Pt can move extremities slowly Does not follow command  Skin: Skin is warm and dry.  Psychiatric:  Consented HC POA on phone     Imaging: Ct Abdomen Pelvis W Contrast  03/27/2014   CLINICAL DATA:  Evaluate small bowel  obstruction  EXAM: CT ABDOMEN AND PELVIS WITH CONTRAST  TECHNIQUE: Multidetector CT imaging of the abdomen and pelvis was performed using the standard protocol following bolus administration of intravenous contrast.  CONTRAST:  100mL OMNIPAQUE IOHEXOL 300 MG/ML  SOLN  COMPARISON:  None.  FINDINGS: Small bilateral pleural effusions right greater than left with dependent atelectasis.  Small bowel loops in the lower abdomen have an abnormal appearance. This is characterized by pneumatosis and extraluminal bowel gas in the adjacent mesenteric. This may represent free intraperitoneal gas or portal venous gas. See images 59 through 69.  There is slight stranding in the mesenteric. No abscess. No free-fluid. Proximal bowel loops are mildly distended. No obvious cause of obstruction. No evidence of twisting of bowel loops. SMA is patent without obvious thrombus.  Gallstones without gallbladder wall thickening. Gallbladder wall calcification.  NG tube tip in the antrum.  Liver, spleen, pancreas, and right adrenal gland are within normal limits. Left adrenal gland is nodular without focal mass.  Calcified soft tissue area in the hilum of the spleen on image 30 measuring 2.3 cm may represent a small splenic artery aneurysm.  Multiple hypodensities in the right kidney are nonspecific. Chronic changes of the left kidney. No hydronephrosis.  Diverticulosis of the colon without evidence of diverticulitis.  There is stranding within the subcutaneous fat of both flanks worse on the left likely related to overall edema.  L5 superior endplate fracture versus large Schmorl's node. This does have a chronic appearance. Vacuum disc at L5-S1. Degenerative disc disease scattered throughout the lumbar spine.  Foley catheter decompresses the bladder. Uterus is absent. Right adnexa is unremarkable. 4.0 x 2.9 cm left adnexal partially calcified soft tissue area.  IMPRESSION: There is pneumatosis an adjacent extraluminal bowel gas as described  worrisome for devitalized small bowel. Surgical consultation in is recommended. Critical Value/emergent results were called by telephone at the time of interpretation on 03/27/2014 at 12:02 pm to Dr. Carron CurieALI HIJAZI , who verbally acknowledged these results.  Cholelithiasis.  Small bilateral pleural effusions and bibasilar atelectasis.  2.3 cm splenic artery aneurysm is not excluded. CT angiogram may better characterize.  Nonspecific right renal hypodensities.  4.0 cm left adnexal mass is nonspecific. This would be uncharacteristic as an ovary based on the patient's age. Ultrasound may be helpful.   Electronically Signed   By: Maryclare BeanArt  Hoss M.D.   On: 03/27/2014 12:03   Dg Chest Port 1 View  04/14/2014   CLINICAL DATA:  Respiratory failure J96.90 (ICD-10-CM)  EXAM: PORTABLE CHEST - 1 VIEW  COMPARISON:  04/10/2014  FINDINGS: Feeding tube exchanged for a nasogastric tube. Tip is not well visualized on this examination. PICC line tip in the SVC region. Patient has a tracheostomy tube. Again noted are prominent interstitial lung markings. Heart size is within normal limits and stable. Negative for a pneumothorax.  IMPRESSION: Prominent interstitial and vascular markings.  Findings may represent mild edema.  Support apparatuses as described.   Electronically Signed   By: Richarda Overlie M.D.   On: 04/14/2014 08:02   Dg Chest Port 1 View  04/10/2014   CLINICAL DATA:  Status post tracheostomy placement  EXAM: PORTABLE CHEST - 1 VIEW  COMPARISON:  04/09/2014  FINDINGS: Cardiac shadow is again enlarged. A left-sided PICC line is stable in appearance. Endotracheal tube is been removed and a tracheostomy tube placed in satisfactory position. A feeding catheter is seen within the stomach. The lungs demonstrate mild interstitial changes but are stable in appearance.  IMPRESSION: Tracheostomy in satisfactory position. The remainder of the exam is stable from the previous day.   Electronically Signed   By: Alcide Clever M.D.   On:  04/10/2014 14:15   Dg Chest Port 1 View  04/09/2014   CLINICAL DATA:  Respiratory failure. Ventilator dependence. Atrial fibrillation. Diabetes.  EXAM: PORTABLE CHEST - 1 VIEW  COMPARISON:  04/08/2014  FINDINGS: Endotracheal tube is 1.7 cm above the carina. Consider retracting 1 cm.  A feeding tube is indistinct distally but is felt to enter the stomach. Left sided central line tip projects over the lower SVC.  Indistinct pulmonary vasculature with bilateral interstitial accentuation noted. Mildly enlarged cardiopericardial silhouette.  IMPRESSION: 1. Mildly enlarged cardiopericardial silhouette with indistinct pulmonary vasculature and interstitial accentuation potentially reflecting interstitial edema. 2. The endotracheal tube tip is 1.7 cm above the carina. Although this is not malpositioned, in order to maintain a margin of safety, consider retracting 1 cm.   Electronically Signed   By: Herbie Baltimore M.D.   On: 04/09/2014 08:06   Dg Chest Port 1 View  04/08/2014   CLINICAL DATA:  PICC line placement  EXAM: PORTABLE CHEST - 1 VIEW  COMPARISON:  04/08/2014  FINDINGS: Endotracheal tube with the tip 4.4 cm above the carina. Anterior to coursing below the diaphragm. Left-sided PICC line with the tip projecting over the cavoatrial junction.  Bilateral mild interstitial thickening. No pleural effusion or pneumothorax. Stable cardiomegaly. Unremarkable osseous structures.  IMPRESSION: 1. Interval placement of a left-sided PICC line with the tip projecting over the cavoatrial junction.   Electronically Signed   By: Elige Ko   On: 04/08/2014 17:50   Dg Chest Port 1 View  04/08/2014   CLINICAL DATA:  Respiratory failure  EXAM: PORTABLE CHEST - 1 VIEW  COMPARISON:  04/07/2014.  03/31/2014.  FINDINGS: Endotracheal tube and feeding tube in stable position. Mediastinum hilar structures are normal. Stable cardiomegaly. Mild bibasilar atelectasis. No significant pleural effusion or pneumothorax. Stable mild  elevation left hemidiaphragm.  IMPRESSION: 1. Lines and tubes in stable position. 2. Mild bibasilar subsegmental atelectasis. 3. Stable cardiomegaly.   Electronically Signed   By: Maisie Fus  Register   On: 04/08/2014 07:19   Dg Chest Port 1 View  04/07/2014   CLINICAL DATA:  Intubation.  EXAM: PORTABLE CHEST - 1 VIEW  COMPARISON:  Chest 04/07/2014 at 5:01 a.m.  FINDINGS: New endotracheal tube is in place the tip in good position at the level of the knee clavicular heads. Feeding tube courses into the stomach and below the inferior margin of the film. Lungs are clear. Heart size is normal. No pneumothorax or pleural effusion.  IMPRESSION: ET tube in good position.  No acute disease.   Electronically Signed   By: Drusilla Kanner M.D.   On: 04/07/2014 06:44   Dg Chest Port 1 View  04/07/2014   CLINICAL DATA:  Shortness of breath.  Increased pulmonary secretions.  EXAM: PORTABLE CHEST - 1 VIEW  COMPARISON:  Single view of the chest 04/01/2014.  FINDINGS: Right IJ catheter and endotracheal tube have been removed since the most recent examination. Feeding tube courses into the stomach and below the inferior margin of the film. Right basilar atelectasis has resolved since the prior examination with improvement seen in the left base. The right lung is clear. Heart size is upper normal. No pneumothorax or pleural effusion.  IMPRESSION: No acute finding.  Improved bibasilar atelectasis.   Electronically Signed   By: Drusilla Kannerhomas  Dalessio M.D.   On: 04/07/2014 05:16   Dg Chest Port 1 View  04/01/2014   CLINICAL DATA:  Acute respiratory failure ; history of diabetes, atrial fibrillation, and morbid obesity  EXAM: PORTABLE CHEST - 1 VIEW  COMPARISON:  Portable chest x-ray of March 31, 2014  FINDINGS: The lungs remain well-expanded. The interstitial markings have increased bilaterally. The left hemidiaphragm is less well demonstrated today. The cardiopericardial silhouette remains enlarged. The pulmonary vascularity is more  engorged centrally today.  The endotracheal tube tip lies approximately 3.5 cm above the crotch of the carina. The esophagogastric tube tip projects below the inferior margin of the image. The right internal jugular venous catheter tip projects over the junction of the proximal and midportions of the SVC.  IMPRESSION: There has been interval deterioration in the appearance of the pulmonary interstitium and the lung bases consistent with interstitial edema and bibasilar atelectasis.   Electronically Signed   By: David  SwazilandJordan   On: 04/01/2014 07:37   Dg Chest Port 1 View  03/31/2014   CLINICAL DATA:  Acute respiratory failure.  EXAM: PORTABLE CHEST - 1 VIEW  COMPARISON:  03/30/2014.  FINDINGS: Unchanged support tubes and apparatus. Cardiomegaly. No significant pulmonary edema. Slight LEFT base opacity is improved. No pneumothorax.  IMPRESSION: Cardiomegaly.  Improved aeration.  Stable support apparatus.   Electronically Signed   By: Davonna BellingJohn  Curnes M.D.   On: 03/31/2014 07:22   Dg Chest Port 1 View  03/30/2014   CLINICAL DATA:  Pleural effusions. Respiratory failure. On ventilator.  EXAM: PORTABLE CHEST - 1 VIEW  COMPARISON:  03/28/2014  FINDINGS: Support lines and tubes in appropriate position. Patient is rotated to the left. Mild opacity in the lateral left lung base may be due to effusion, atelectasis, or infiltrate. Right lung appears clear. Heart size is stable.  IMPRESSION: Patient rotated to the left. Persistent opacity lateral left lung base may be due to effusion, atelectasis, or infiltrate.   Electronically Signed   By: Myles RosenthalJohn  Stahl M.D.   On: 03/30/2014 11:02   Dg Chest Port 1 View  03/28/2014   CLINICAL DATA:  Respiratory distress  EXAM: PORTABLE CHEST - 1 VIEW  COMPARISON:  03/27/2014  FINDINGS: Endotracheal tube tip 3.1 cm proximal to the carina. Right IJ catheter tip projects over the mid SVC. NG tube descends below the level of the image. Mildly prominent cardiac contour. Aortic atherosclerosis.  Small left greater than right pleural effusion and associated airspace opacity. Mild right lung base airspace opacity. No pneumothorax. No interval osseous change.  IMPRESSION: Unchanged support devices as above.  Small left greater than right pleural effusions and associated airspace opacities; may reflect atelectasis, aspiration, or pneumonia.   Electronically Signed   By: Jearld LeschAndrew  DelGaizo M.D.   On: 03/28/2014 04:51   Dg Chest Port 1 View  03/27/2014   CLINICAL DATA:  Respiratory failure  EXAM: PORTABLE CHEST - 1 VIEW  COMPARISON:  03/26/2014  FINDINGS: Endotracheal tube in good position. Right jugular catheter tip in the SVC. NG tube enters the stomach.  Improved aeration. Decrease in bibasilar atelectasis. Negative for edema or effusion.  IMPRESSION: Support lines remain in good position.  Improvement in bibasilar atelectasis.   Electronically Signed   By: Marlan Palau M.D.   On: 03/27/2014 08:14   Dg Chest Port 1 View  03/26/2014   CLINICAL DATA:  Respirator weaning, shortness of breath.  EXAM: PORTABLE CHEST - 1 VIEW  COMPARISON:  03/25/2014  FINDINGS: Endotracheal tube tip 2.3 cm proximal to the carina. Right IJ catheter tip projects over the proximal SVC. NG tube descends below the level of the image. Aortic tortuosity. Heart size upper normal to mildly enlarged. Mild interstitial and hazy right greater than left infrahilar airspace opacities. Small effusions not excluded. No pneumothorax. No interval osseous change.  IMPRESSION: Similar appearance to the support devices as above.  Mild infrahilar opacities may reflect atelectasis, infiltrate, and/or edema.   Electronically Signed   By: Jearld Lesch M.D.   On: 03/26/2014 04:12   Dg Chest Port 1 View  03/25/2014   CLINICAL DATA:  Respiratory distress. Patient being weaned off ventilation.  EXAM: PORTABLE CHEST - 1 VIEW  COMPARISON:  03/25/2014 at 5:01 a.m.  FINDINGS: Endotracheal tube, nasogastric tube and right internal jugular central  venous line are stable and well positioned.  Mild prominence of the peribronchovascular interstitium in the right lower lung, without change. No lung consolidation or edema. No pleural effusion or pneumothorax.  IMPRESSION: 1. No acute findings. 2. Support apparatus is stable and well positioned.   Electronically Signed   By: Amie Portland M.D.   On: 03/25/2014 11:09   Dg Chest Port 1 View  03/25/2014   CLINICAL DATA:  Excessive fluid intake.  EXAM: PORTABLE CHEST - 1 VIEW  COMPARISON:  03/24/2014, 03/22/2014, and 03/21/2014  FINDINGS: Endotracheal tube, central line, and NG tube are in place, unchanged. Pulmonary vascularity is normal. Lungs are clear.  IMPRESSION: No change since the prior study.  Clear lungs.   Electronically Signed   By: Geanie Cooley M.D.   On: 03/25/2014 07:23   Dg Chest Port 1 View  03/24/2014   CLINICAL DATA:  Respiratory failure.  EXAM: PORTABLE CHEST - 1 VIEW  COMPARISON:  03/22/2014  FINDINGS: Endotracheal tube is roughly 3.0 cm above the carina. Central line tip in the upper SVC region. Lung markings are mildly prominent but there is no focal airspace disease. Heart size is normal. Nasogastric tube extends into the abdomen. Negative for a pneumothorax.  IMPRESSION: Stable chest radiograph findings. Prominent lung markings without focal airspace disease.  Stable support apparatuses.   Electronically Signed   By: Richarda Overlie M.D.   On: 03/24/2014 08:08   Dg Chest Port 1 View  03/22/2014   CLINICAL DATA:  Respiratory failure.  EXAM: PORTABLE CHEST - 1 VIEW  COMPARISON:  03/21/2014.  FINDINGS: Mild basilar atelectasis is stable. No lung consolidation or convincing edema. Stable mild enlargement of the cardiac silhouette.  Endotracheal tube tip projects 2.2 cm above the carina. Right internal jugular central venous line and nasogastric tube are stable in well positioned.  IMPRESSION: 1. No significant change from the prior study. Mild persistent basilar atelectasis. No edema or  convincing pneumonia. 2. Support apparatus is stable and well positioned.   Electronically Signed   By: Amie Portland M.D.   On: 03/22/2014 09:25   Dg Chest Port 1  View  03/21/2014   CLINICAL DATA:  Endotracheal intubation.  EXAM: PORTABLE CHEST - 1 VIEW  COMPARISON:  None.  FINDINGS: Endotracheal tube tip measures 3.4 cm above the carina. Enteric tube tip is not visualized off the field of view but is below the left hemidiaphragm. Right central venous catheter tip over the mid SVC region. Shallow inspiration. Atelectasis in the lung bases. Mild cardiac enlargement. Pulmonary vascularity is probably normal for technique. Possible blunting the costophrenic angles suggesting small effusions. No pneumothorax. No focal consolidation.  IMPRESSION: Appliances appear to be in satisfactory location. Cardiac enlargement and small pleural effusions. Atelectasis in the lung bases.   Electronically Signed   By: Burman Nieves M.D.   On: 03/21/2014 23:01   Dg Abd Portable 1v  04/12/2014   CLINICAL DATA:  Nasogastric tube placement.  EXAM: PORTABLE ABDOMEN - 1 VIEW  COMPARISON:  Abdominal radiograph 04/02/2014  FINDINGS: Nasogastric tube can be seen in the distal esophagus, and terminates in the stomach, likely in the distal body/antral region. The stomach is decompressed. Gas is seen within nondistended colon. The entire abdomen is not included on this image.  IMPRESSION: Satisfactory position of nasogastric tube.   Electronically Signed   By: Britta Mccreedy M.D.   On: 04/12/2014 14:13   Dg Abd Portable 1v  04/02/2014   CLINICAL DATA:  Feeding tube placement.  EXAM: PORTABLE ABDOMEN - 1 VIEW  COMPARISON:  03/25/2014  FINDINGS: Feeding tube tip overlies the level of the distal stomach. Bowel gas pattern is nonobstructive.  IMPRESSION: Feeding tube tip overlying the level of the distal stomach.   Electronically Signed   By: Rosalie Gums M.D.   On: 04/02/2014 16:34   Dg Abd Portable 1v  03/25/2014   CLINICAL DATA:   68 year old female with vomiting and abdominal distension 1 week ago. Evaluate nasogastric tube position. Initial encounter.  EXAM: PORTABLE ABDOMEN - 1 VIEW  COMPARISON:  03/23/2014.  FINDINGS: Nasogastric tube tip directed inferiorly at the expected level of the second portion of the duodenum.  Abnormal bowel gas pattern with gas distended small bowel loops with thickened folds. There is gas with an slightly prominent size colon. Findings may reflect underlying ileus although obstruction cannot be excluded. The degree of bowel wall thickening appears more prominent than on the prior exam.  The possibility of free intraperitoneal air cannot be assessed on a supine view.  IMPRESSION: Feeding tube tip proximal duodenal level.  Progressive abnormal bowel gas pattern as noted above.   Electronically Signed   By: Bridgett Larsson M.D.   On: 03/25/2014 15:38   Dg Abd Portable 1v  03/23/2014   CLINICAL DATA:  Ileus, abdominal distension  EXAM: PORTABLE ABDOMEN - 1 VIEW  COMPARISON:  Portable exam 1404 hr compared to 03/21/2014  FINDINGS: Air-filled large and small bowel loops throughout abdomen question ileus.  No bowel wall thickening or point of obstruction.  Tip of nasogastric tube projects over proximal descending duodenum.  Improved aeration in LEFT lower lobe versus previous exam.  Bones demineralized.  Scattered atherosclerotic calcification.  IMPRESSION: Question ileus.   Electronically Signed   By: Ulyses Southward M.D.   On: 03/23/2014 14:27   Dg Abd Portable 1v  03/21/2014   CLINICAL DATA:  NG tube placement.  EXAM: PORTABLE ABDOMEN - 1 VIEW  COMPARISON:  None.  FINDINGS: Enteric tube tip projects over the mid abdomen consistent with location in the lower stomach.  IMPRESSION: Enteric tube tip projects over the mid abdomen consistent with  location in the lower stomach.   Electronically Signed   By: Burman Nieves M.D.   On: 03/21/2014 22:58    Labs:  CBC:  Recent Labs  04/09/14 0700 04/10/14 1400  04/12/14 0530 04/14/14 0500  WBC 8.2 7.5 9.8 10.7*  HGB 9.1* 9.1* 9.1* 9.2*  HCT 29.4* 29.9* 29.6* 29.7*  PLT 211 181 195 242    COAGS:  Recent Labs  03/27/14 0500  04/09/14 0700  04/12/14 0530 04/13/14 0654 04/14/14 0500 04/15/14 0500  INR 1.18  < > 1.43  < > 1.62* 2.08* 2.31* 1.62*  APTT 40*  --  38*  --   --   --   --   --   < > = values in this interval not displayed.  BMP:  Recent Labs  04/09/14 0700 04/10/14 1400 04/12/14 0530 04/14/14 0500  NA 142 143 140 143  K 3.3* 3.2* 3.6* 3.9  CL 98 102 100 103  CO2 33* 31 29 30   GLUCOSE 279* 206* 232* 274*  BUN 53* 41* 54* 53*  CALCIUM 8.4 8.4 9.0 9.3  CREATININE 2.44* 2.05* 1.97* 1.83*  GFRNONAA 19* 24* 25* 27*  GFRAA 22* 27* 29* 31*    LIVER FUNCTION TESTS:  Recent Labs  03/22/14 0640 03/24/14 1200 03/27/14 0500 04/03/14 0630  BILITOT 0.9 0.9 0.8 0.5  AST 24 67* 95* 27  ALT 76* 93* 157* 42*  ALKPHOS 84 83 77 68  PROT 6.7 6.2 6.1 6.9  ALBUMIN 2.0* 2.0* 2.0* 2.4*    TUMOR MARKERS: No results for input(s): AFPTM, CEA, CA199, CHROMGRNA in the last 8760 hours.  Assessment and Plan:  PNA- ventilated Hx afib Developed necrotic bowel Treated medically and did somewhat well Underwent reintubation and now with trach Agitation; PCM Encephalopathy Need for long term care Scheduled for perc G tube in IR when INR wnl Afeb; off coumadin INR 1.62 11/17 Will check labs in am Consent in chart  Thank you for this interesting consult.  I greatly enjoyed meeting LAYLYNN CAMPANELLA and look forward to participating in their care.    I spent a total of 40 minutes face to face in clinical consultation, greater than 50% of which was counseling/coordinating care for percutaneous gastric tube placement  Signed: Alizzon Dioguardi A 04/15/2014, 2:41 PM

## 2014-04-16 ENCOUNTER — Other Ambulatory Visit (HOSPITAL_COMMUNITY): Payer: Self-pay

## 2014-04-16 LAB — CBC
HEMATOCRIT: 29.2 % — AB (ref 36.0–46.0)
HEMOGLOBIN: 8.9 g/dL — AB (ref 12.0–15.0)
MCH: 30 pg (ref 26.0–34.0)
MCHC: 30.5 g/dL (ref 30.0–36.0)
MCV: 98.3 fL (ref 78.0–100.0)
Platelets: 247 10*3/uL (ref 150–400)
RBC: 2.97 MIL/uL — ABNORMAL LOW (ref 3.87–5.11)
RDW: 16 % — ABNORMAL HIGH (ref 11.5–15.5)
WBC: 8.7 10*3/uL (ref 4.0–10.5)

## 2014-04-16 LAB — BASIC METABOLIC PANEL
ANION GAP: 7 (ref 5–15)
BUN: 35 mg/dL — ABNORMAL HIGH (ref 6–23)
CO2: 33 meq/L — AB (ref 19–32)
Calcium: 9.3 mg/dL (ref 8.4–10.5)
Chloride: 100 mEq/L (ref 96–112)
Creatinine, Ser: 1.51 mg/dL — ABNORMAL HIGH (ref 0.50–1.10)
GFR calc Af Amer: 40 mL/min — ABNORMAL LOW (ref >90)
GFR calc non Af Amer: 34 mL/min — ABNORMAL LOW (ref >90)
GLUCOSE: 229 mg/dL — AB (ref 70–99)
Potassium: 3.7 mEq/L (ref 3.7–5.3)
SODIUM: 140 meq/L (ref 137–147)

## 2014-04-16 LAB — PROTIME-INR
INR: 2.76 — AB (ref 0.00–1.49)
Prothrombin Time: 29.4 seconds — ABNORMAL HIGH (ref 11.6–15.2)

## 2014-04-16 NOTE — Progress Notes (Signed)
Patient ID: Amanda CharonMartha J Paolini, female   DOB: 1946-05-14, 68 y.o.   MRN: [id number] Pt's PT/INR 29.4/2.76 today. G tube placement has been postponed until INR within acceptable level. Will cont to monitor . Nurse aware.

## 2014-04-17 ENCOUNTER — Other Ambulatory Visit (HOSPITAL_COMMUNITY): Payer: Self-pay

## 2014-04-17 DIAGNOSIS — Z90710 Acquired absence of both cervix and uterus: Secondary | ICD-10-CM | POA: Insufficient documentation

## 2014-04-17 DIAGNOSIS — Z930 Tracheostomy status: Secondary | ICD-10-CM

## 2014-04-17 LAB — PROTIME-INR
INR: 1.41 (ref 0.00–1.49)
PROTHROMBIN TIME: 17.4 s — AB (ref 11.6–15.2)

## 2014-04-17 MED ORDER — FENTANYL CITRATE 0.05 MG/ML IJ SOLN
INTRAMUSCULAR | Status: AC | PRN
  Administered 2014-04-17: 50 ug via INTRAVENOUS

## 2014-04-17 MED ORDER — MIDAZOLAM HCL 2 MG/2ML IJ SOLN
INTRAMUSCULAR | Status: AC | PRN
  Administered 2014-04-17: 1 mg via INTRAVENOUS

## 2014-04-17 MED ORDER — IOHEXOL 300 MG/ML  SOLN
50.0000 mL | Freq: Once | INTRAMUSCULAR | Status: AC | PRN
  Administered 2014-04-17: 30 mL

## 2014-04-17 NOTE — Procedures (Signed)
Successful fluoroscopic guided insertion of gastrostomy tube without immediate post procedural complicatoin.   The gastrostomy tube may be used immediately for medications.  Otherwise, place gastrostomy tube to low wall suction for 24 hrs.  Tube feeds may be initiated in 24 hours as per the primary team.   

## 2014-04-17 NOTE — Sedation Documentation (Signed)
Patient here from [facility] unit on trach and ventilator at 100%. Patient is alert and follows commands.

## 2014-04-17 NOTE — Progress Notes (Signed)
PULMONARY / CRITICAL CARE MEDICINE   Name: Amanda CharonMartha J Yang MRN: [id number] DOB: 09-26-1945    ADMISSION DATE:  04/02/2014 CONSULTATION DATE:  11/9  [facility]  CHIEF COMPLAINT: SOB  Brief hx: 68 y/o female initially [facility] with ETT for vent wean / ?trach in setting PNA, Afib RVR. Developed worsening AMS and n/v and CT abd concerning for necrotic bowel. [facility].  She did not require surgery, [facility] She was re intubated 11/9 and PCCM again asked to consult.  STUDIES/EVENTS: 10/15 - 10/23 - [name] -? For group B strep 03/21/14 - TX to [facility] at [name] 03/22/14 - MRSA Trach aspirate + 03/27/14 - MOVE TO [place]  10/29 CT Ab/Pelvis > Free air from possible necrotic small bowel, cholilithiasis, small bilateral pleural effusions and bibasilar atelectasis  10/29 [facility]  10/29 CCS consult: No indication for immediate surgery. CCS to to continue to follow clinically 10/31 Weaning on 14/5, 40%, benign abd exam  03/30/14: RT reports pt weaning on PSV 14/5 due to low volumes, tolerating well. No acute distress 03/31/14: CCM ruled out acute abdomen. Pall care consult: terminal wean ony if vegetatie. Full code +, Trach as option + 04/01/14 - EXTUBATED 11/3 CCS signed off 11/4 refuses NIMVS 11[facility] reintubated for hypercabia 11/12 trached per JY 11/19 for PEG  SUBJECTIVE/ INTERVAL Hx:  More awake  VITAL SIGNS: Vital signs reviewed. Abnormal values will appear under impression plan section.    INTAKE / OUTPUT: No intake or output data in the 24 hours ending 04/17/14 0937  PHYSICAL EXAMINATION: General: chronically ill in NAD, MO trached,  alopecia noted Neuro: Awake and interactive on way to have peg placed in IR HEENT: trach, mm pink/moist, NGT in place ,  Lungs: Diminished throughout Abdomen: Obese, mildly distended, decreased BS but  present, non tender or minimally tender, no rebound or guarding. NGT in place with TF on hold for PEG Ext: BUE edema, no LE edema  LABS:  CBC  Recent Labs Lab 04/12/14 0530 04/14/14 0500 04/16/14 0555  WBC 9.8 10.7* 8.7  HGB 9.1* 9.2* 8.9*  HCT 29.6* 29.7* 29.2*  PLT 195 242 247   Coag's  Recent Labs Lab 04/15/14 0500 04/16/14 0555 04/17/14 0620  INR 1.62* 2.76* 1.41   BMET  Recent Labs Lab 04/12/14 0530 04/14/14 0500 04/16/14 0555  NA 140 143 140  K 3.6* 3.9 3.7  CL 100 103 100  CO2 29 30 33*  BUN 54* 53* 35*  CREATININE 1.97* 1.83* 1.51*  GLUCOSE 232* 274* 229*   Electrolytes  Recent Labs Lab 04/12/14 0530 04/14/14 0500 04/16/14 0555  CALCIUM 9.0 9.3 9.3   Sepsis Markers No results for input(s): LATICACIDVEN, PROCALCITON, O2SATVEN in the last 168 hours. ABG No results for input(s): PHART, PCO2ART, PO2ART in the last 168 hours. Liver Enzymes No results for input(s): AST, ALT, ALKPHOS, BILITOT, ALBUMIN in the last 168 hours. Cardiac Enzymes No results for input(s): TROPONINI, PROBNP in the last 168 hours. Glucose No results for input(s): GLUCAP in the last 168 hours.  Imaging No results found.   ASSESSMENT / PLAN:  PULMONARY  OETT 10/20 ([name])>>> 04/01/14 ([facility])>>11/3,  Reintubated 11/9>>11/12 Trached 11/12>> A:  Suspected baseline OSA/OHV Acute on chronic respiratory failure requiring repeated and prolonged ventilation - r/t Group B strep CAP followed by MRSA PNA.   P:  Wean per protocol now that she is trached Abx per primary. Keep as dry as able.  Brett CanalesSteve Lundon Rosier ACNP [facility] Pager [phone] till 3 pm If no answer page [phone] 04/17/2014, 9:37 AM

## 2014-04-18 LAB — BASIC METABOLIC PANEL
Anion gap: 10 (ref 5–15)
BUN: 28 mg/dL — AB (ref 6–23)
CALCIUM: 9.2 mg/dL (ref 8.4–10.5)
CO2: 33 mEq/L — ABNORMAL HIGH (ref 19–32)
CREATININE: 1.46 mg/dL — AB (ref 0.50–1.10)
Chloride: 101 mEq/L (ref 96–112)
GFR, EST AFRICAN AMERICAN: 41 mL/min — AB (ref >90)
GFR, EST NON AFRICAN AMERICAN: 36 mL/min — AB (ref >90)
GLUCOSE: 141 mg/dL — AB (ref 70–99)
Potassium: 3.6 mEq/L — ABNORMAL LOW (ref 3.7–5.3)
Sodium: 144 mEq/L (ref 137–147)

## 2014-04-18 LAB — PROTIME-INR
INR: 1.22 (ref 0.00–1.49)
Prothrombin Time: 15.5 seconds — ABNORMAL HIGH (ref 11.6–15.2)

## 2014-04-19 LAB — PROTIME-INR
INR: 1.34 (ref 0.00–1.49)
Prothrombin Time: 16.7 seconds — ABNORMAL HIGH (ref 11.6–15.2)

## 2014-04-20 LAB — PROTIME-INR
INR: 1.45 (ref 0.00–1.49)
Prothrombin Time: 17.8 seconds — ABNORMAL HIGH (ref 11.6–15.2)

## 2014-04-21 ENCOUNTER — Other Ambulatory Visit (HOSPITAL_COMMUNITY): Payer: Self-pay

## 2014-04-21 DIAGNOSIS — Z4659 Encounter for fitting and adjustment of other gastrointestinal appliance and device: Secondary | ICD-10-CM | POA: Insufficient documentation

## 2014-04-21 DIAGNOSIS — L03319 Cellulitis of trunk, unspecified: Secondary | ICD-10-CM

## 2014-04-21 DIAGNOSIS — K9422 Gastrostomy infection: Secondary | ICD-10-CM | POA: Insufficient documentation

## 2014-04-21 LAB — BASIC METABOLIC PANEL
Anion gap: 9 (ref 5–15)
BUN: 30 mg/dL — AB (ref 6–23)
CHLORIDE: 98 meq/L (ref 96–112)
CO2: 36 meq/L — AB (ref 19–32)
Calcium: 9.4 mg/dL (ref 8.4–10.5)
Creatinine, Ser: 1.36 mg/dL — ABNORMAL HIGH (ref 0.50–1.10)
GFR calc Af Amer: 45 mL/min — ABNORMAL LOW (ref >90)
GFR calc non Af Amer: 39 mL/min — ABNORMAL LOW (ref >90)
GLUCOSE: 147 mg/dL — AB (ref 70–99)
POTASSIUM: 3.1 meq/L — AB (ref 3.7–5.3)
Sodium: 143 mEq/L (ref 137–147)

## 2014-04-21 LAB — CBC
HEMATOCRIT: 29.5 % — AB (ref 36.0–46.0)
HEMOGLOBIN: 8.9 g/dL — AB (ref 12.0–15.0)
MCH: 30.8 pg (ref 26.0–34.0)
MCHC: 30.2 g/dL (ref 30.0–36.0)
MCV: 102.1 fL — ABNORMAL HIGH (ref 78.0–100.0)
Platelets: 259 10*3/uL (ref 150–400)
RBC: 2.89 MIL/uL — AB (ref 3.87–5.11)
RDW: 15.9 % — ABNORMAL HIGH (ref 11.5–15.5)
WBC: 10.7 10*3/uL — AB (ref 4.0–10.5)

## 2014-04-21 LAB — PROTIME-INR
INR: 1.76 — AB (ref 0.00–1.49)
PROTHROMBIN TIME: 20.7 s — AB (ref 11.6–15.2)

## 2014-04-21 NOTE — Progress Notes (Signed)
PULMONARY / CRITICAL CARE MEDICINE   Name: Amanda CharonMartha J Massimino MRN: [id number] DOB: 1946/03/04    ADMISSION DATE:  04/02/2014 CONSULTATION DATE:  11/9  [facility]  CHIEF COMPLAINT: SOB  Brief hx: 68 y/o female initially admitted [facility] with ETT for vent wean / ?trach in setting PNA, Afib RVR. Developed worsening AMS and n/v and CT abd concerning for necrotic bowel. [facility].  She did not require surgery, was extubated and [facility] She was re intubated 11/9 and PCCM again asked to consult.  STUDIES/EVENTS: 10/15 - 10/23 - [name] -? For group B strep 03/21/14 - TX to [facility] at [name] 03/22/14 - MRSA Trach aspirate + 03/27/14 - MOVE TO [place]  10/29 CT Ab/Pelvis > Free air from possible necrotic small bowel, cholilithiasis, small bilateral pleural effusions and bibasilar atelectasis  10/29 [facility]  10/29 CCS consult: No indication for immediate surgery. CCS to to continue to follow clinically 10/31 Weaning on 14/5, 40%, benign abd exam  03/30/14: RT reports pt weaning on PSV 14/5 due to low volumes, tolerating well. No acute distress 03/31/14: CCM ruled out acute abdomen. Pall care consult: terminal wean ony if vegetatie. Full code +, Trach as option + 04/01/14 - EXTUBATED 11/3 CCS signed off 11/4 refuses NIMVS 11[facility] reintubated for hypercabia 11/12 trached per JY  SUBJECTIVE/ INTERVAL Hx:  More awake  VITAL SIGNS: Vital signs reviewed. Abnormal values will appear under impression plan section.    INTAKE / OUTPUT: No intake or output data in the 24 hours ending 04/21/14 1018  PHYSICAL EXAMINATION: General: chronically ill in NAD, MO trached No distress on ATC.  alopecia noted Neuro: Awake and interactive HEENT: trach, mm pink/moist, NGT in place ,  Lungs: Diminished throughout Abdomen: Obese, mildly distended, decreased BS but present, non tender or minimally  tender, no rebound or guarding. NGT in place with TF running Ext: BUE edema, no LE edema  LABS:  CBC  Recent Labs Lab 04/16/14 0555 04/21/14 0500  WBC 8.7 10.7*  HGB 8.9* 8.9*  HCT 29.2* 29.5*  PLT 247 259   Coag's  Recent Labs Lab 04/19/14 0500 04/20/14 0500 04/21/14 0500  INR 1.34 1.45 1.76*   BMET  Recent Labs Lab 04/16/14 0555 04/18/14 0558 04/21/14 0500  NA 140 144 143  K 3.7 3.6* 3.1*  CL 100 101 98  CO2 33* 33* 36*  BUN 35* 28* 30*  CREATININE 1.51* 1.46* 1.36*  GLUCOSE 229* 141* 147*   Electrolytes  Recent Labs Lab 04/16/14 0555 04/18/14 0558 04/21/14 0500  CALCIUM 9.3 9.2 9.4   Imaging No results found.   ASSESSMENT / PLAN:   OETT 10/20 ([name])>>> 04/01/14 ([facility])>>11/3,  Reintubated 11/9>>11/12 Trached 11/12>>  Assessment  Acute on chronic respiratory failure requiring repeated and prolonged ventilation - r/t Group B strep CAP followed by MRSA PNA.  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 MS better. Had trouble w/ PMV. Hope we can go down to 6 cuffless after she gets to 24 hrs off vent. Still getting versed bolus doses.   Plan:  Wean per protocol now that she is trached Once off vent may benefit to downsize to 4 cuffless and pmv re attampts Goal 16 hrs today Would not remove trach  Keep as dry as able.  Antionette PolesPeter E  Tanja PortBabcock, [place]NP [phone]  STAFF NOTE: I, Rory Percyaniel Feinstein, MD FACP have personally reviewed patient's available data, including medical history, events of note, physical examination and test results as part of my evaluation. I have discussed with resident/NP and other care providers such as pharmacist, RN and RRT. In addition, I personally evaluated patient and elicited key findings of: goal 16 hrs, likely to 4 cuff less as goal once off vent  Mcarthur Rossettianiel J. Tyson AliasFeinstein, MD, FACP Pgr: [phone] [place] [facility] 04/21/2014 12:37  PM

## 2014-04-22 LAB — PROTIME-INR
INR: 1.89 — AB (ref 0.00–1.49)
Prothrombin Time: 21.9 seconds — ABNORMAL HIGH (ref 11.6–15.2)

## 2014-04-23 LAB — BASIC METABOLIC PANEL
Anion gap: 7 (ref 5–15)
BUN: 32 mg/dL — ABNORMAL HIGH (ref 6–23)
CALCIUM: 9.5 mg/dL (ref 8.4–10.5)
CO2: 37 mEq/L — ABNORMAL HIGH (ref 19–32)
Chloride: 99 mEq/L (ref 96–112)
Creatinine, Ser: 1.41 mg/dL — ABNORMAL HIGH (ref 0.50–1.10)
GFR calc non Af Amer: 37 mL/min — ABNORMAL LOW (ref >90)
GFR, EST AFRICAN AMERICAN: 43 mL/min — AB (ref >90)
GLUCOSE: 207 mg/dL — AB (ref 70–99)
POTASSIUM: 4.3 meq/L (ref 3.7–5.3)
SODIUM: 143 meq/L (ref 137–147)

## 2014-04-23 LAB — PROTIME-INR
INR: 2.21 — ABNORMAL HIGH (ref 0.00–1.49)
PROTHROMBIN TIME: 24.8 s — AB (ref 11.6–15.2)

## 2014-04-23 LAB — CBC
HCT: 30.6 % — ABNORMAL LOW (ref 36.0–46.0)
Hemoglobin: 8.9 g/dL — ABNORMAL LOW (ref 12.0–15.0)
MCH: 30 pg (ref 26.0–34.0)
MCHC: 29.1 g/dL — ABNORMAL LOW (ref 30.0–36.0)
MCV: 103 fL — AB (ref 78.0–100.0)
PLATELETS: 294 10*3/uL (ref 150–400)
RBC: 2.97 MIL/uL — ABNORMAL LOW (ref 3.87–5.11)
RDW: 15.8 % — AB (ref 11.5–15.5)
WBC: 12.6 10*3/uL — ABNORMAL HIGH (ref 4.0–10.5)

## 2014-04-24 ENCOUNTER — Other Ambulatory Visit (HOSPITAL_COMMUNITY): Payer: Self-pay

## 2014-04-24 LAB — PROTIME-INR
INR: 2.36 — ABNORMAL HIGH (ref 0.00–1.49)
PROTHROMBIN TIME: 26 s — AB (ref 11.6–15.2)

## 2014-04-25 LAB — PROTIME-INR
INR: 2.13 — ABNORMAL HIGH (ref 0.00–1.49)
Prothrombin Time: 24 seconds — ABNORMAL HIGH (ref 11.6–15.2)

## 2014-04-25 LAB — CBC
HCT: 27.8 % — ABNORMAL LOW (ref 36.0–46.0)
HEMOGLOBIN: 8.4 g/dL — AB (ref 12.0–15.0)
MCH: 30.3 pg (ref 26.0–34.0)
MCHC: 30.2 g/dL (ref 30.0–36.0)
MCV: 100.4 fL — AB (ref 78.0–100.0)
Platelets: 278 10*3/uL (ref 150–400)
RBC: 2.77 MIL/uL — AB (ref 3.87–5.11)
RDW: 15.5 % (ref 11.5–15.5)
WBC: 10 10*3/uL (ref 4.0–10.5)

## 2014-04-25 LAB — BASIC METABOLIC PANEL
Anion gap: 8 (ref 5–15)
BUN: 44 mg/dL — ABNORMAL HIGH (ref 6–23)
CALCIUM: 9 mg/dL (ref 8.4–10.5)
CO2: 37 meq/L — AB (ref 19–32)
CREATININE: 1.29 mg/dL — AB (ref 0.50–1.10)
Chloride: 97 mEq/L (ref 96–112)
GFR calc Af Amer: 48 mL/min — ABNORMAL LOW (ref >90)
GFR calc non Af Amer: 41 mL/min — ABNORMAL LOW (ref >90)
GLUCOSE: 150 mg/dL — AB (ref 70–99)
Potassium: 3.5 mEq/L — ABNORMAL LOW (ref 3.7–5.3)
Sodium: 142 mEq/L (ref 137–147)

## 2014-04-25 NOTE — Progress Notes (Signed)
PULMONARY / CRITICAL CARE MEDICINE   Name: Amanda CharonMartha J Yang MRN: [id number] DOB: 08-25-1945    ADMISSION DATE:  04/02/2014 CONSULTATION DATE:  11/9  [facility]  CHIEF COMPLAINT: SOB  Brief hx: 68 y/o female initially admitted [facility] with ETT for vent wean / ?trach in setting PNA, Afib RVR. Developed worsening AMS and n/v and CT abd concerning for necrotic bowel. [facility].  She did not require surgery, was extub[facility] She was re intubated 11/9 and PCCM again asked to consult.  STUDIES/EVENTS: 10/15 - 10/23 - [name] -? For group B strep 03/21/14 - TX to [facility] [name] 03/22/14 - MRSA Trach aspirate + 03/27/14 - MOVE TO [place]  10/29 CT Ab/Pelvis > Free air from possible necrotic small bowel, cholilithiasis, small bilateral pleural effusions and bibasilar atelectasis  10/29 [facility]  10/29 CCS consult: No indication for immediate surgery. CCS to to continue to follow clinically 10/31 Weaning on 14/5, 40%, benign abd exam  03/30/14: RT reports pt weaning on PSV 14/5 due to low volumes, tolerating well. No acute distress 03/31/14: CCM ruled out acute abdomen. Pall care consult: terminal wean ony if vegetatie. Full code +, Trach as option + 04/01/14 - EXTUBATED 11/3 CCS signed off 11/4 refuses NIMVS 11[facility] reintubated for hypercabia 11/12 trached per JY  SUBJECTIVE/ INTERVAL Hx:  More awake  VITAL SIGNS: Vital signs reviewed. Abnormal values will appear under impression plan section.    INTAKE / OUTPUT: No intake or output data in the 24 hours ending 04/25/14 0852  PHYSICAL EXAMINATION: General: chronically ill in NAD, MO trached No distress on ATC.  alopecia noted Neuro: Awake and interactive HEENT: trach, mm pink/moist, NGT in place ,  Lungs: Diminished throughout Abdomen: Obese, mildly distended, decreased BS but present, non tender or minimally  tender, no rebound or guarding. NGT in place with TF running Ext: BUE edema, no LE edema  LABS:  CBC  Recent Labs Lab 04/21/14 0500 04/23/14 0410 04/25/14 0500  WBC 10.7* 12.6* 10.0  HGB 8.9* 8.9* 8.4*  HCT 29.5* 30.6* 27.8*  PLT 259 294 278   Coag's  Recent Labs Lab 04/23/14 0410 04/24/14 0500 04/25/14 0500  INR 2.21* 2.36* 2.13*   BMET  Recent Labs Lab 04/21/14 0500 04/23/14 0410 04/25/14 0500  NA 143 143 142  K 3.1* 4.3 3.5*  CL 98 99 97  CO2 36* 37* 37*  BUN 30* 32* 44*  CREATININE 1.36* 1.41* 1.29*  GLUCOSE 147* 207* 150*   Electrolytes  Recent Labs Lab 04/21/14 0500 04/23/14 0410 04/25/14 0500  CALCIUM 9.4 9.5 9.0   Imaging Dg Chest Port 1 View  04/24/2014   CLINICAL DATA:  Respiratory failure.  EXAM: PORTABLE CHEST - 1 VIEW  COMPARISON:  04/21/2014  FINDINGS: Heart is mildly enlarged. There is mild pulmonary edema, similar in appearance to prior study. Tracheostomy tube appears unchanged. No significant consolidations.  IMPRESSION: Stable appearance of cardiomegaly and mild pulmonary edema.   Electronically Signed   By: Rosalie GumsBeth  Brown M.D.   On: 04/24/2014 15:35     ASSESSMENT / PLAN:   OETT 10/20 ([name])>>> 04/01/14 ([facility])>>11/3,  Reintubated 11/9>>11/12 Trached 11/12>>  Assessment  Acute on chronic respiratory failure requiring repeated and prolonged ventilation - r/t Group B strep CAP followed by MRSA PNA.  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 Had trouble w/ PMV. Hope we can go down to 6 cuffless after she gets to 24 hrs off vent. Still getting versed bolus doses.   Plan:  Wean per protocol now that she is trached Goal 16 hrs today Would not remove trach  Keep as dry as able.  Storm FriskPatrick E Wright, MD   [place] [facility] 04/25/2014 8:52 AM

## 2014-04-26 LAB — PROTIME-INR
INR: 2.31 — ABNORMAL HIGH (ref 0.00–1.49)
Prothrombin Time: 25.6 seconds — ABNORMAL HIGH (ref 11.6–15.2)

## 2014-04-27 LAB — BASIC METABOLIC PANEL
Anion gap: 10 (ref 5–15)
BUN: 37 mg/dL — AB (ref 6–23)
CHLORIDE: 94 meq/L — AB (ref 96–112)
CO2: 37 mEq/L — ABNORMAL HIGH (ref 19–32)
Calcium: 9.1 mg/dL (ref 8.4–10.5)
Creatinine, Ser: 1.22 mg/dL — ABNORMAL HIGH (ref 0.50–1.10)
GFR calc non Af Amer: 44 mL/min — ABNORMAL LOW (ref >90)
GFR, EST AFRICAN AMERICAN: 51 mL/min — AB (ref >90)
Glucose, Bld: 147 mg/dL — ABNORMAL HIGH (ref 70–99)
POTASSIUM: 3.3 meq/L — AB (ref 3.7–5.3)
SODIUM: 141 meq/L (ref 137–147)

## 2014-04-27 LAB — CULTURE, RESPIRATORY W GRAM STAIN: Special Requests: NORMAL

## 2014-04-27 LAB — PROTIME-INR
INR: 2.23 — AB (ref 0.00–1.49)
Prothrombin Time: 24.9 seconds — ABNORMAL HIGH (ref 11.6–15.2)

## 2014-04-27 LAB — CULTURE, RESPIRATORY

## 2014-04-27 LAB — CBC
HEMATOCRIT: 30 % — AB (ref 36.0–46.0)
HEMOGLOBIN: 9 g/dL — AB (ref 12.0–15.0)
MCH: 30.1 pg (ref 26.0–34.0)
MCHC: 30 g/dL (ref 30.0–36.0)
MCV: 100.3 fL — ABNORMAL HIGH (ref 78.0–100.0)
Platelets: 295 10*3/uL (ref 150–400)
RBC: 2.99 MIL/uL — ABNORMAL LOW (ref 3.87–5.11)
RDW: 15.7 % — AB (ref 11.5–15.5)
WBC: 9.9 10*3/uL (ref 4.0–10.5)

## 2014-04-27 LAB — CORTISOL: CORTISOL PLASMA: 13 ug/dL

## 2014-04-28 LAB — CORTISOL-AM, BLOOD: Cortisol - AM: 15.8 ug/dL (ref 4.3–22.4)

## 2014-04-28 LAB — PROTIME-INR
INR: 2.35 — ABNORMAL HIGH (ref 0.00–1.49)
PROTHROMBIN TIME: 25.9 s — AB (ref 11.6–15.2)

## 2014-04-28 NOTE — Progress Notes (Signed)
PULMONARY / CRITICAL CARE MEDICINE   Name: Amanda Yang MRN: [id number] DOB: 03/28/1946    ADMISSION DATE:  04/02/2014 CONSULTATION DATE:  11/9  [facility]  CHIEF COMPLAINT: SOB  Brief hx: [age] y/o female initially [facility] with ETT for vent wean / ? trach in setting PNA, Afib RVR. Developed worsening AMS and n/v and CT abd concerning for necrotic bowel. [facility].  She did not require surgery, was [facility] She was re intubated 11/9 and PCCM again asked to consult.  STUDIES/EVENTS: 10/15 - 10/23 - [name] -? For group B strep 03/21/14 - [place] to [facility] [name] 03/22/14 - MRSA Trach aspirate + 03/27/14 - MOVE TO [place]  10/29 CT Ab/Pelvis > Free air from possible necrotic small bowel, cholilithiasis, small bilateral pleural effusions and bibasilar atelectasis  10/29 [facility]  10/29 CCS consult: No indication for immediate surgery. CCS to to continue to follow clinically 10/31 Weaning on 14/5, 40%, benign abd exam  03/30/14: RT reports pt weaning on PSV 14/5 due to low volumes, tolerating well. No acute distress 03/31/14: CCM ruled out acute abdomen. Pall care consult: terminal wean ony if vegetatie. Full code +, Trach as option + 04/01/14 - EXTUBATED 11/3 CCS signed off 11/4 refuses NIMVS [facility] reintubated for hypercabia 11/12 trached per JY  SUBJECTIVE/ INTERVAL Hx:  More awake  VITAL SIGNS: Vital signs reviewed. Abnormal values will appear under impression plan section.    INTAKE / OUTPUT: No intake or output data in the 24 hours ending 04/28/14 0909  PHYSICAL EXAMINATION: General: chronically ill in NAD, MO trached No distress on ATC.  alopecia noted Neuro: Awake and interactive HEENT: trach, mm pink/moist, NGT in place ,  Lungs: Diminished throughout Abdomen: Obese, mildly distended, decreased BS but present, non tender or minimally  tender, no rebound or guarding. NGT in place with TF running Ext: BUE edema, no LE edema  LABS:  CBC  Recent Labs Lab 04/23/14 0410 04/25/14 0500 04/27/14 0500  WBC 12.6* 10.0 9.9  HGB 8.9* 8.4* 9.0*  HCT 30.6* 27.8* 30.0*  PLT 294 278 295   Coag's  Recent Labs Lab 04/26/14 0500 04/27/14 0500 04/28/14 0500  INR 2.31* 2.23* 2.35*   BMET  Recent Labs Lab 04/23/14 0410 04/25/14 0500 04/27/14 0500  NA 143 142 141  K 4.3 3.5* 3.3*  CL 99 97 94*  CO2 37* 37* 37*  BUN 32* 44* 37*  CREATININE 1.41* 1.29* 1.22*  GLUCOSE 207* 150* 147*   Electrolytes  Recent Labs Lab 04/23/14 0410 04/25/14 0500 04/27/14 0500  CALCIUM 9.5 9.0 9.1   Imaging No results found.   ASSESSMENT / PLAN:   OETT 10/20 ([name])>>> 04/01/14 ([facility])>>11/3,  Reintubated 11/9>>11/12 Trached 11/12>>  Assessment  Acute on chronic respiratory failure requiring repeated and prolonged ventilation - r/t Group B strep CAP followed by MRSA PNA.  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 Had trouble w/ PMV. Hope we can go down to 6 cuffless after she gets to 24 hrs off vent. Still getting versed bolus doses.   Plan:  Wean per protocol now that she is trached Goal 16 hrs today - failed today, consider to PS 5 for 6  hr today then back to TC in am  As goal Would not remove trach  Keep as dry as able.  Simonne MartinetPeter E Babcock, NP 04/28/2014 9:09 AM   STAFF NOTE: Cindi CarbonI, Shamere Campas, MD FACP have personally reviewed patient's available data, including medical history, events of note, physical examination and test results as part of my evaluation. I have discussed with resident/NP and other care providers such as pharmacist, RN and RRT. In addition, I personally evaluated patient and elicited key findings of: failed trach collar, attempt goal 6 hr PS 5 then back in am to TC goal, would repeat pcxr with this sudden  desaturation  Mcarthur Rossettianiel J. Tyson AliasFeinstein, MD, FACP Pgr: [phone] [place] [facility] 04/28/2014 11:37 AM

## 2014-04-29 ENCOUNTER — Other Ambulatory Visit (HOSPITAL_COMMUNITY): Payer: Self-pay

## 2014-04-29 LAB — CBC
HCT: 29.8 % — ABNORMAL LOW (ref 36.0–46.0)
HEMOGLOBIN: 8.7 g/dL — AB (ref 12.0–15.0)
MCH: 29.4 pg (ref 26.0–34.0)
MCHC: 29.2 g/dL — AB (ref 30.0–36.0)
MCV: 100.7 fL — AB (ref 78.0–100.0)
Platelets: 283 10*3/uL (ref 150–400)
RBC: 2.96 MIL/uL — ABNORMAL LOW (ref 3.87–5.11)
RDW: 15.3 % (ref 11.5–15.5)
WBC: 9.1 10*3/uL (ref 4.0–10.5)

## 2014-04-29 LAB — BASIC METABOLIC PANEL
ANION GAP: 9 (ref 5–15)
BUN: 33 mg/dL — AB (ref 6–23)
CALCIUM: 9.2 mg/dL (ref 8.4–10.5)
CO2: 39 meq/L — AB (ref 19–32)
CREATININE: 1.05 mg/dL (ref 0.50–1.10)
Chloride: 96 mEq/L (ref 96–112)
GFR calc Af Amer: 61 mL/min — ABNORMAL LOW (ref >90)
GFR calc non Af Amer: 53 mL/min — ABNORMAL LOW (ref >90)
GLUCOSE: 168 mg/dL — AB (ref 70–99)
Potassium: 3.4 mEq/L — ABNORMAL LOW (ref 3.7–5.3)
Sodium: 144 mEq/L (ref 137–147)

## 2014-04-29 LAB — PROTIME-INR
INR: 2.37 — AB (ref 0.00–1.49)
Prothrombin Time: 26.1 seconds — ABNORMAL HIGH (ref 11.6–15.2)

## 2014-04-30 LAB — PROTIME-INR
INR: 2.38 — ABNORMAL HIGH (ref 0.00–1.49)
Prothrombin Time: 26.2 seconds — ABNORMAL HIGH (ref 11.6–15.2)

## 2014-05-01 LAB — CBC
HCT: 29.3 % — ABNORMAL LOW (ref 36.0–46.0)
HCT: 34.9 % — ABNORMAL LOW (ref 36.0–46.0)
HEMOGLOBIN: 8.7 g/dL — AB (ref 12.0–15.0)
Hemoglobin: 10.3 g/dL — ABNORMAL LOW (ref 12.0–15.0)
MCH: 30.1 pg (ref 26.0–34.0)
MCH: 30.9 pg (ref 26.0–34.0)
MCHC: 29.7 g/dL — ABNORMAL LOW (ref 30.0–36.0)
MCHC: 29.5 g/dL — ABNORMAL LOW (ref 30.0–36.0)
MCV: 101.4 fL — AB (ref 78.0–100.0)
MCV: 104.8 fL — AB (ref 78.0–100.0)
Platelets: 293 10*3/uL (ref 150–400)
Platelets: 360 10*3/uL (ref 150–400)
RBC: 2.89 MIL/uL — AB (ref 3.87–5.11)
RBC: 3.33 MIL/uL — AB (ref 3.87–5.11)
RDW: 15.1 % (ref 11.5–15.5)
RDW: 14.9 % (ref 11.5–15.5)
WBC: 10.2 10*3/uL (ref 4.0–10.5)
WBC: 13.8 10*3/uL — AB (ref 4.0–10.5)

## 2014-05-01 LAB — BASIC METABOLIC PANEL
ANION GAP: 8 (ref 5–15)
Anion gap: 8 (ref 5–15)
BUN: 31 mg/dL — AB (ref 6–23)
BUN: 31 mg/dL — ABNORMAL HIGH (ref 6–23)
CHLORIDE: 97 meq/L (ref 96–112)
CO2: 39 meq/L — AB (ref 19–32)
CO2: 39 meq/L — AB (ref 19–32)
CREATININE: 1.03 mg/dL (ref 0.50–1.10)
Calcium: 9.6 mg/dL (ref 8.4–10.5)
Calcium: 10 mg/dL (ref 8.4–10.5)
Chloride: 95 mEq/L — ABNORMAL LOW (ref 96–112)
Creatinine, Ser: 1.09 mg/dL (ref 0.50–1.10)
GFR calc Af Amer: 59 mL/min — ABNORMAL LOW (ref >90)
GFR calc Af Amer: 63 mL/min — ABNORMAL LOW (ref >90)
GFR calc non Af Amer: 51 mL/min — ABNORMAL LOW (ref >90)
GFR calc non Af Amer: 54 mL/min — ABNORMAL LOW (ref >90)
GLUCOSE: 166 mg/dL — AB (ref 70–99)
Glucose, Bld: 203 mg/dL — ABNORMAL HIGH (ref 70–99)
Potassium: 3.7 mEq/L (ref 3.7–5.3)
Potassium: 4 mEq/L (ref 3.7–5.3)
SODIUM: 144 meq/L (ref 137–147)
SODIUM: 142 meq/L (ref 137–147)

## 2014-05-01 LAB — MAGNESIUM: MAGNESIUM: 1.8 mg/dL (ref 1.5–2.5)

## 2014-05-01 LAB — CULTURE, BLOOD (ROUTINE X 2)
CULTURE: NO GROWTH
Culture: NO GROWTH

## 2014-05-01 LAB — PROTIME-INR
INR: 2.55 — ABNORMAL HIGH (ref 0.00–1.49)
Prothrombin Time: 27.6 seconds — ABNORMAL HIGH (ref 11.6–15.2)

## 2014-05-01 NOTE — Progress Notes (Signed)
PULMONARY / CRITICAL CARE MEDICINE   Name: Amanda Yang MRN: [id number] DOB: 1945/12/15    ADMISSION DATE:  04/02/2014 CONSULTATION DATE:  11/9  [facility]  CHIEF COMPLAINT: SOB  Brief hx: 68 y/o female initially [facility] with ETT for vent wean / ? trach in setting PNA, Afib RVR. Developed worsening AMS and n/v and CT abd concerning for necrotic bowel. [facility].  She did not require surgery, was extubated and [facility] She was re intubated 11/9 and PCCM again asked to consult.  STUDIES/EVENTS: 10/15 - 10/23 - [name] -? For group B strep 03/21/14 - [place] to [facility] [name] 03/22/14 - MRSA Trach aspirate + 03/27/14 - MOVE TO [place]  10/29 CT Ab/Pelvis > Free air from possible necrotic small bowel, cholilithiasis, small bilateral pleural effusions and bibasilar atelectasis  10/29 [facility]  10/29 CCS consult: No indication for immediate surgery. CCS to to continue to follow clinically 10/31 Weaning on 14/5, 40%, benign abd exam  03/30/14: RT reports pt weaning on PSV 14/5 due to low volumes, tolerating well. No acute distress 03/31/14: CCM ruled out acute abdomen. Pall care consult: terminal wean ony if vegetatie. Full code +, Trach as option + 04/01/14 - EXTUBATED 11/3 CCS signed off 11/4 refuses NIMVS [facility] reintubated for hypercabia 11/12 trached per JY 12/3 12 hrs t collar SUBJECTIVE/ INTERVAL Hx:  More awake  VITAL SIGNS: Vital signs reviewed. Abnormal values will appear under impression plan section.    INTAKE / OUTPUT: No intake or output data in the 24 hours ending 05/01/14 0814  PHYSICAL EXAMINATION: General: chronically ill in NAD, MO trached No distress on ATC.  alopecia noted Neuro: Awake and interactive HEENT: trach, mm pink/moist,   Lungs: Diminished throughout Abdomen: Obese, mildly distended, decreased BS but present, non tender or  minimally tender, no rebound or guarding. NGT in place with TF running Ext: BUE edema, no LE edema  LABS:  CBC  Recent Labs Lab 04/27/14 0500 04/29/14 0500 05/01/14 0635  WBC 9.9 9.1 10.2  HGB 9.0* 8.7* 8.7*  HCT 30.0* 29.8* 29.3*  PLT 295 283 293   Coag's  Recent Labs Lab 04/29/14 0500 04/30/14 0500 05/01/14 0635  INR 2.37* 2.38* 2.55*   BMET  Recent Labs Lab 04/27/14 0500 04/29/14 0500 05/01/14 0635  NA 141 144 144  K 3.3* 3.4* 3.7  CL 94* 96 97  CO2 37* 39* 39*  BUN 37* 33* 31*  CREATININE 1.22* 1.05 1.09  GLUCOSE 147* 168* 166*   Electrolytes  Recent Labs Lab 04/27/14 0500 04/29/14 0500 05/01/14 0635  CALCIUM 9.1 9.2 9.6   Imaging No results found.   ASSESSMENT / PLAN:   OETT 10/20 ([name])>>> 04/01/14 ([facility])>>11/3,  Reintubated 11/9>>11/12 Trached 11/12>>  Assessment  Acute on chronic respiratory failure requiring repeated and prolonged ventilation - r/t Group B strep CAP followed by MRSA PNA.  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  Brett CanalesSteve Minor ACNP [facility] Pager [phone] till 3 pm If no answer page [phone] 05/01/2014, 8:15 AM   STAFF NOTE: I, Rory Percyaniel Makinzi Prieur, MD FACP have personally reviewed patient's  available data, including medical history, events of note, physical examination and test results as part of my evaluation. I have discussed with resident/NP and other care providers such as pharmacist, RN and RRT. In addition, I personally evaluated patient and elicited key findings of: goal on trach collar 16-24 hrs today, appears well on this, however lack of progres on PMV, sudden poor perfromace, d/w speech, cocners upper airway are real, will consent for bronch assessment of uper airway in am , dc PMV  fr now, cta lungs  Mcarthur Rossettianiel J. Tyson AliasFeinstein, MD, FACP Pgr: [phone] [place] [facility] 05/01/2014 5:07 PM

## 2014-05-02 ENCOUNTER — Encounter (HOSPITAL_COMMUNITY): Payer: Self-pay

## 2014-05-02 ENCOUNTER — Other Ambulatory Visit (HOSPITAL_COMMUNITY): Payer: Self-pay

## 2014-05-02 LAB — PROTIME-INR
INR: 2.57 — ABNORMAL HIGH (ref 0.00–1.49)
Prothrombin Time: 27.8 seconds — ABNORMAL HIGH (ref 11.6–15.2)

## 2014-05-02 NOTE — Procedures (Signed)
Bedside Bronchoscopy Procedure Note Amanda CharonMartha J Yang [id number] November 16, 1945  Procedure: Bronchoscopy Indications: Diagnostic evaluation of the airways  Procedure Details: ET Tube Size: ET Tube secured at lip (cm): Bite block in place: Yes In preparation for procedure, Patient hyper-oxygenated with 100 % FiO2: Bronchoscope removed.    Evaluation BP 160/84 mmHg  Pulse 97  Resp 20  SpO2 100% Breath Sounds:Rhonch O2 sats: stable throughout Patient's Current Condition: stable Specimens:  None Complications: No apparent complications Patient did tolerate procedure well.   Cherylin MylarDoyle, Nadirah Socorro 05/02/2014, 3:52 PM

## 2014-05-02 NOTE — Procedures (Signed)
Bronchoscopy Procedure Note Noah CharonMartha J Battaglia [id number] [birth year]  Procedure: Bronchoscopy Indications: Diagnostic evaluation of the airways   Failed PMV, r/o airway obstruction above  Procedure Details Consent: Risks of procedure as well as the alternatives and risks of each were explained to the (patient/caregiver).  Consent for procedure obtained. Time Out: Verified patient identification, verified procedure, site/side was marked, verified correct patient position, special equipment/implants available, medications/allergies/relevent history reviewed, required imaging and test results available.  Performed  In preparation for procedure, patient was given 100% FiO2 and bronchoscope lubricated. Sedation: Etomidate Placed through nose and mouth, evaluated cords, post arynoids, did not enter airway   Evaluation Hemodynamic Status: BP stable throughout; O2 sats: stable throughout Patient's Current Condition: stable Specimens:  None Complications: No apparent complications Patient did tolerate procedure well.   Nelda BucksFEINSTEIN,DANIEL J. 05/02/2014   1. Severe swollen post arrytnoids and supraglottics 2. Cords likely wnl, difficult to see  Steroids IV  Mcarthur Rossettianiel J. Tyson AliasFeinstein, MD, FACP Pgr: [phone] [place] [facility]

## 2014-05-03 LAB — URINE MICROSCOPIC-ADD ON

## 2014-05-03 LAB — URINALYSIS, ROUTINE W REFLEX MICROSCOPIC
Bilirubin Urine: NEGATIVE
GLUCOSE, UA: 100 mg/dL — AB
Ketones, ur: NEGATIVE mg/dL
Leukocytes, UA: NEGATIVE
Nitrite: NEGATIVE
Protein, ur: NEGATIVE mg/dL
Specific Gravity, Urine: 1.015 (ref 1.005–1.030)
Urobilinogen, UA: 1 mg/dL (ref 0.0–1.0)
pH: 5.5 (ref 5.0–8.0)

## 2014-05-03 LAB — PROTIME-INR
INR: 2.57 — AB (ref 0.00–1.49)
Prothrombin Time: 27.8 seconds — ABNORMAL HIGH (ref 11.6–15.2)

## 2014-05-04 LAB — PROTIME-INR
INR: 2.96 — ABNORMAL HIGH (ref 0.00–1.49)
PROTHROMBIN TIME: 31.1 s — AB (ref 11.6–15.2)

## 2014-05-05 ENCOUNTER — Other Ambulatory Visit (HOSPITAL_COMMUNITY): Payer: Self-pay

## 2014-05-05 LAB — BASIC METABOLIC PANEL
Anion gap: 11 (ref 5–15)
BUN: 44 mg/dL — ABNORMAL HIGH (ref 6–23)
CO2: 41 mEq/L (ref 19–32)
Calcium: 9.5 mg/dL (ref 8.4–10.5)
Chloride: 91 mEq/L — ABNORMAL LOW (ref 96–112)
Creatinine, Ser: 0.97 mg/dL (ref 0.50–1.10)
GFR calc Af Amer: 68 mL/min — ABNORMAL LOW (ref >90)
GFR calc non Af Amer: 58 mL/min — ABNORMAL LOW (ref >90)
Glucose, Bld: 245 mg/dL — ABNORMAL HIGH (ref 70–99)
POTASSIUM: 3.8 meq/L (ref 3.7–5.3)
SODIUM: 143 meq/L (ref 137–147)

## 2014-05-05 LAB — CBC WITH DIFFERENTIAL/PLATELET
BASOS ABS: 0 10*3/uL (ref 0.0–0.1)
Basophils Relative: 0 % (ref 0–1)
EOS PCT: 0 % (ref 0–5)
Eosinophils Absolute: 0 10*3/uL (ref 0.0–0.7)
HEMATOCRIT: 32.9 % — AB (ref 36.0–46.0)
HEMOGLOBIN: 9.8 g/dL — AB (ref 12.0–15.0)
LYMPHS PCT: 3 % — AB (ref 12–46)
Lymphs Abs: 0.5 10*3/uL — ABNORMAL LOW (ref 0.7–4.0)
MCH: 30.8 pg (ref 26.0–34.0)
MCHC: 29.8 g/dL — ABNORMAL LOW (ref 30.0–36.0)
MCV: 103.5 fL — ABNORMAL HIGH (ref 78.0–100.0)
MONOS PCT: 6 % (ref 3–12)
Monocytes Absolute: 1.1 10*3/uL — ABNORMAL HIGH (ref 0.1–1.0)
NEUTROS ABS: 16.4 10*3/uL — AB (ref 1.7–7.7)
Neutrophils Relative %: 91 % — ABNORMAL HIGH (ref 43–77)
Platelets: 360 10*3/uL (ref 150–400)
RBC: 3.18 MIL/uL — AB (ref 3.87–5.11)
RDW: 14.7 % (ref 11.5–15.5)
WBC: 18 10*3/uL — AB (ref 4.0–10.5)

## 2014-05-05 LAB — MAGNESIUM: MAGNESIUM: 1.8 mg/dL (ref 1.5–2.5)

## 2014-05-05 LAB — PROTIME-INR
INR: 3.02 — AB (ref 0.00–1.49)
Prothrombin Time: 31.6 seconds — ABNORMAL HIGH (ref 11.6–15.2)

## 2014-05-05 NOTE — Progress Notes (Signed)
PULMONARY / CRITICAL CARE MEDICINE   Name: Amanda Yang MRN: [id number] DOB: March 02, 1946    ADMISSION DATE:  04/02/2014 CONSULTATION DATE:  11/9  [facility]ERRING MD :  [facility]  CHIEF COMPLAINT: SOB  Brief hx: 68 y/o female initially admitted to [facility]/23 with ETT for vent wean / ? trach in setting PNA, Afib RVR. Developed worsening AMS and n/v and CT abd concerning for necrotic bowel. [facility]/ [facility].  She did not require surgery, was extubated and [facility] She was re intubated 11/9 and PCCM again asked to consult.  STUDIES/EVENTS: 10/15 - 10/23 - [name] -? For group B strep 03/21/14 - TX to [facility] at [name] 03/22/14 - MRSA Trach aspirate + 03/27/14 - MOVE TO [place]  10/29 CT Ab/Pelvis > Free air from possible necrotic small bowel, cholilithiasis, small bilateral pleural effusions and bibasilar atelectasis  10/29 tx to [facility] from [facility]  10/29 CCS consult: No indication for immediate surgery. CCS to to continue to follow clinically 10/31 Weaning on 14/5, 40%, benign abd exam  03/30/14: RT reports pt weaning on PSV 14/5 due to low volumes, tolerating well. No acute distress 03/31/14: CCM ruled out acute abdomen. Pall care consult: terminal wean ony if vegetatie. Full code +, Trach as option + 04/01/14 - EXTUBATED 11/3 CCS signed off 11/4 refuses NIMVS 11/[facility] reintubated for hypercabia 11/12 trached per JY 11/19 IR peg 12/3 12 hrs t collar SUBJECTIVE/ INTERVAL Hx:  More awake  VITAL SIGNS: Vital signs reviewed. Abnormal values will appear under impression plan section.   INTAKE / OUTPUT: No intake or output data in the 24 hours ending 05/05/14 0940  PHYSICAL EXAMINATION: General: chronically ill in NAD, MO trached No distress on ATC.  alopecia noted Neuro: Awake and interactive HEENT: trach, mm pink/moist,   Lungs: Diminished throughout Abdomen: Obese, mildly distended, decreased BS but present, non  tender , no rebound or guarding. PEG in place with TF running Ext: BUE edema, no LE edema  LABS:  CBC  Recent Labs Lab 05/01/14 0635 05/01/14 1933 05/05/14 0500  WBC 10.2 13.8* 18.0*  HGB 8.7* 10.3* 9.8*  HCT 29.3* 34.9* 32.9*  PLT 293 360 360   Coag's  Recent Labs Lab 05/03/14 0627 05/04/14 0500 05/05/14 0500  INR 2.57* 2.96* 3.02*   BMET  Recent Labs Lab 05/01/14 0635 05/01/14 1933 05/05/14 0500  NA 144 142 143  K 3.7 4.0 3.8  CL 97 95* 91*  CO2 39* 39* 41*  BUN 31* 31* 44*  CREATININE 1.09 1.03 0.97  GLUCOSE 166* 203* 245*   Electrolytes  Recent Labs Lab 05/01/14 0635 05/01/14 1933 05/05/14 0500  CALCIUM 9.6 10.0 9.5  MG  --  1.8 1.8   Imaging No results found.   ASSESSMENT / PLAN:   OETT 10/20 ([name])>>> 04/01/14 ([facility])>>11/3,  Reintubated 11/9>>11/12 Trached 11/12>>  Assessment  Acute on chronic respiratory failure requiring repeated and prolonged ventilation - r/t Group B strep CAP followed by MRSA PNA.  Suspected baseline OSA/OHS Mild AKI Hypokalemia  Anemia of critical illness  Discussion  12/7 liberated from vent.PCCM will see PRN as needed  Plan:  Off vent >72 hours Would not remove trach  Keep as dry as able. PCCM availble PRN  Brett CanalesSteve Minor ACNP [facility] Pager [phone] till 3 pm If no answer page [phone] 05/05/2014, 9:40 AM  TC for 24 hours, pushing to 48 at this point.  Once at 72 then remove vent from room.  No decannulation given physical condition.  Would look for trach SNF at this point.  PCCM will see PRN.  Patient seen and examined, agree with above note.  I dictated the care and orders written for this patient under my direction.  Alyson ReedyWesam G Yacoub, MD [phone]

## 2014-05-06 ENCOUNTER — Other Ambulatory Visit (HOSPITAL_COMMUNITY): Payer: Medicare Other

## 2014-05-06 LAB — CULTURE, RESPIRATORY W GRAM STAIN: Gram Stain: NONE SEEN

## 2014-05-06 LAB — CULTURE, RESPIRATORY

## 2014-05-06 LAB — PROTIME-INR
INR: 2.65 — ABNORMAL HIGH (ref 0.00–1.49)
PROTHROMBIN TIME: 28.4 s — AB (ref 11.6–15.2)

## 2014-05-07 LAB — CBC
HEMATOCRIT: 29.9 % — AB (ref 36.0–46.0)
Hemoglobin: 8.9 g/dL — ABNORMAL LOW (ref 12.0–15.0)
MCH: 30.2 pg (ref 26.0–34.0)
MCHC: 29.8 g/dL — AB (ref 30.0–36.0)
MCV: 101.4 fL — AB (ref 78.0–100.0)
Platelets: 298 10*3/uL (ref 150–400)
RBC: 2.95 MIL/uL — ABNORMAL LOW (ref 3.87–5.11)
RDW: 14.5 % (ref 11.5–15.5)
WBC: 12.3 10*3/uL — ABNORMAL HIGH (ref 4.0–10.5)

## 2014-05-07 LAB — BASIC METABOLIC PANEL
Anion gap: 6 (ref 5–15)
BUN: 56 mg/dL — ABNORMAL HIGH (ref 6–23)
CALCIUM: 9.3 mg/dL (ref 8.4–10.5)
CO2: 43 mEq/L (ref 19–32)
Chloride: 94 mEq/L — ABNORMAL LOW (ref 96–112)
Creatinine, Ser: 0.96 mg/dL (ref 0.50–1.10)
GFR calc non Af Amer: 59 mL/min — ABNORMAL LOW (ref >90)
GFR, EST AFRICAN AMERICAN: 68 mL/min — AB (ref >90)
Glucose, Bld: 237 mg/dL — ABNORMAL HIGH (ref 70–99)
POTASSIUM: 3.5 meq/L — AB (ref 3.7–5.3)
Sodium: 143 mEq/L (ref 137–147)

## 2014-05-07 LAB — PROTIME-INR
INR: 2.38 — AB (ref 0.00–1.49)
Prothrombin Time: 26.2 seconds — ABNORMAL HIGH (ref 11.6–15.2)

## 2014-05-08 LAB — PROTIME-INR
INR: 1.94 — ABNORMAL HIGH (ref 0.00–1.49)
Prothrombin Time: 22.3 seconds — ABNORMAL HIGH (ref 11.6–15.2)

## 2014-05-08 NOTE — Progress Notes (Signed)
PULMONARY / CRITICAL CARE MEDICINE   Name: Amanda CharonMartha J Yang MRN: [id number] DOB: [birth year]    ADMISSION DATE:  04/02/2014 CONSULTATION DATE:  11/9  REFERRING MD :  [facility]  CHIEF COMPLAINT: SOB  Brief hx: 68 y/o female initially admitted [facility] with ETT for vent wean / ? trach in setting PNA, Afib RVR. Developed worsening AMS and n/v and CT abd concerning for necrotic bowel. [facility].  She did not require surgery, [facility] She was re intubated 11/9 and PCCM again asked to consult.  STUDIES/EVENTS: 10/15 - 10/23 - [name] -? For group B strep 03/21/14 - TX to [facility] at [name] 03/22/14 - MRSA Trach aspirate + 03/27/14 - MOVE TO [place]  10/29 CT Ab/Pelvis > Free air from possible necrotic small bowel, cholilithiasis, small bilateral pleural effusions and bibasilar atelectasis  10/29 tx [facility]  10/29 CCS consult: No indication for immediate surgery. CCS to to continue to follow clinically 10/31 Weaning on 14/5, 40%, benign abd exam  03/30/14: RT reports pt weaning on PSV 14/5 due to low volumes, tolerating well. No acute distress 03/31/14: CCM ruled out acute abdomen. Pall care consult: terminal wean ony if vegetatie. Full code +, Trach as option + 04/01/14 - EXTUBATED 11/3 CCS signed off 11/4 refuses NIMVS 11/[facility] reintubated for hypercabia 11/12 trached per JY 11/19 IR peg 12/3 12 hrs t collar 12/8 decompensated and plugged trach, had to be changed to 6 cuffed XLT and put back on the vent.  SUBJECTIVE/ INTERVAL Hx:  No events overnight, awake and interactive  VITAL SIGNS: Vital signs reviewed. Abnormal values will appear under impression plan section.   INTAKE / OUTPUT: No intake or output data in the 24 hours ending 05/08/14 1139  PHYSICAL EXAMINATION: General: chronically ill in NAD, MO trached No distress on ATC.  alopecia noted Neuro: Awake and  interactive HEENT: trach, mm pink/moist,   Lungs: Diminished throughout Abdomen: Obese, mildly distended, decreased BS but present, non tender , no rebound or guarding. PEG in place with TF running Ext: BUE edema, no LE edema  LABS:  CBC  Recent Labs Lab 05/01/14 1933 05/05/14 0500 05/07/14 0500  WBC 13.8* 18.0* 12.3*  HGB 10.3* 9.8* 8.9*  HCT 34.9* 32.9* 29.9*  PLT 360 360 298   Coag's  Recent Labs Lab 05/06/14 0500 05/07/14 0500 05/08/14 0500  INR 2.65* 2.38* 1.94*   BMET  Recent Labs Lab 05/01/14 1933 05/05/14 0500 05/07/14 0500  NA 142 143 143  K 4.0 3.8 3.5*  CL 95* 91* 94*  CO2 39* 41* 43*  BUN 31* 44* 56*  CREATININE 1.03 0.97 0.96  GLUCOSE 203* 245* 237*   Electrolytes  Recent Labs Lab 05/01/14 1933 05/05/14 0500 05/07/14 0500  CALCIUM 10.0 9.5 9.3  MG 1.8 1.8  --    Imaging No results found.   ASSESSMENT / PLAN:   OETT 10/20 ([name])>>> 04/01/14 ([facility])>>11/3,  Reintubated 11/9>>11/12 Trached 11/12>>  Assessment  Acute on chronic respiratory failure requiring repeated and prolonged ventilation - r/t Group B strep CAP followed by MRSA PNA.  Suspected baseline OSA/OHS Mild AKI Hypokalemia  Anemia of critical illness  Discussion  Had to go back on vent after decompensation due to plugging and trach had to be changed.  Plan:  Goal is TC for 12 hours. Advance quickly to 24/7 TC. No decannulation given overall physical condition.  Will f/u on Monday.  Alyson ReedyWesam G. Yacoub, M.D. [facility] Pulmonary/Critical Care Medicine. Pager: [phone]. After hours pager: [phone].

## 2014-05-09 LAB — CBC
HEMATOCRIT: 33 % — AB (ref 36.0–46.0)
HEMOGLOBIN: 10.1 g/dL — AB (ref 12.0–15.0)
MCH: 30 pg (ref 26.0–34.0)
MCHC: 30.6 g/dL (ref 30.0–36.0)
MCV: 97.9 fL (ref 78.0–100.0)
Platelets: 309 10*3/uL (ref 150–400)
RBC: 3.37 MIL/uL — AB (ref 3.87–5.11)
RDW: 14.4 % (ref 11.5–15.5)
WBC: 14.6 10*3/uL — ABNORMAL HIGH (ref 4.0–10.5)

## 2014-05-09 LAB — BASIC METABOLIC PANEL
Anion gap: 9 (ref 5–15)
BUN: 46 mg/dL — ABNORMAL HIGH (ref 6–23)
CALCIUM: 8.9 mg/dL (ref 8.4–10.5)
CO2: 41 meq/L — AB (ref 19–32)
CREATININE: 0.75 mg/dL (ref 0.50–1.10)
Chloride: 89 mEq/L — ABNORMAL LOW (ref 96–112)
GFR calc Af Amer: >90 mL/min (ref >90)
GFR calc non Af Amer: 84 mL/min — ABNORMAL LOW (ref >90)
GLUCOSE: 265 mg/dL — AB (ref 70–99)
Potassium: 3.5 mEq/L — ABNORMAL LOW (ref 3.7–5.3)
Sodium: 139 mEq/L (ref 137–147)

## 2014-05-09 LAB — VANCOMYCIN, TROUGH: VANCOMYCIN TR: 20.6 ug/mL — AB (ref 10.0–20.0)

## 2014-05-09 LAB — PROTIME-INR
INR: 1.93 — ABNORMAL HIGH (ref 0.00–1.49)
Prothrombin Time: 22.3 seconds — ABNORMAL HIGH (ref 11.6–15.2)

## 2014-05-10 LAB — PROTIME-INR
INR: 2 — ABNORMAL HIGH (ref 0.00–1.49)
Prothrombin Time: 22.9 s — ABNORMAL HIGH (ref 11.6–15.2)

## 2014-05-11 LAB — BASIC METABOLIC PANEL
ANION GAP: 8 (ref 5–15)
BUN: 47 mg/dL — ABNORMAL HIGH (ref 6–23)
CO2: 44 mEq/L (ref 19–32)
CREATININE: 0.8 mg/dL (ref 0.50–1.10)
Calcium: 8.6 mg/dL (ref 8.4–10.5)
Chloride: 89 mEq/L — ABNORMAL LOW (ref 96–112)
GFR calc non Af Amer: 74 mL/min — ABNORMAL LOW (ref >90)
GFR, EST AFRICAN AMERICAN: 85 mL/min — AB (ref >90)
Glucose, Bld: 198 mg/dL — ABNORMAL HIGH (ref 70–99)
POTASSIUM: 3.5 meq/L — AB (ref 3.7–5.3)
Sodium: 141 mEq/L (ref 137–147)

## 2014-05-11 LAB — MAGNESIUM: Magnesium: 1.7 mg/dL (ref 1.5–2.5)

## 2014-05-11 LAB — PROTIME-INR
INR: 1.74 — AB (ref 0.00–1.49)
Prothrombin Time: 20.5 seconds — ABNORMAL HIGH (ref 11.6–15.2)

## 2014-05-12 LAB — PROTIME-INR
INR: 1.92 — AB (ref 0.00–1.49)
INR: 2.07 — ABNORMAL HIGH (ref 0.00–1.49)
Prothrombin Time: 22.2 seconds — ABNORMAL HIGH (ref 11.6–15.2)
Prothrombin Time: 23.5 seconds — ABNORMAL HIGH (ref 11.6–15.2)

## 2014-05-12 NOTE — Progress Notes (Signed)
PULMONARY / CRITICAL CARE MEDICINE   Name: Amanda CharonMartha J Daw MRN: [id number] DOB: 27-Aug-1945    ADMISSION DATE:  04/02/2014 CONSULTATION DATE:  11/9  [facility]  CHIEF COMPLAINT: SOB  Brief hx: 68 y/o female initially admitted to [facility] with ETT for vent wean / ? trach in setting PNA, Afib RVR. Developed worsening AMS and n/v and CT abd concerning for necrotic bowel. [facility]/ [facility].  She did not require surgery, was [facility] She was re intubated 11/9 and PCCM again asked to consult.  STUDIES/EVENTS: 10/15 - 10/23 - [name] -? For group B strep 03/21/14 - TX to [facility] at [name] 03/22/14 - MRSA Trach aspirate + 03/27/14 - MOVE TO [place]  10/29 CT Ab/Pelvis > Free air from possible necrotic small bowel, cholilithiasis, small bilateral pleural effusions and bibasilar atelectasis  10/29 tx [facility]  10/29 CCS consult: No indication for immediate surgery. CCS to to continue to follow clinically 10/31 Weaning on 14/5, 40%, benign abd exam  03/30/14: RT reports pt weaning on PSV 14/5 due to low volumes, tolerating well. No acute distress 03/31/14: CCM ruled out acute abdomen. Pall care consult: terminal wean ony if vegetatie. Full code +, Trach as option + 04/01/14 - EXTUBATED 11/3 CCS signed off 11/4 refuses NIMVS 11/[facility] reintubated for hypercabia 11/12 trached per JY 11/19 IR peg 12/3 12 hrs t collar 12/8 decompensated and plugged trach, had to be changed to 6 cuffed XLT and put back on the vent. 12/14 48 hours off vent.   SUBJECTIVE/ INTERVAL Hx:  No events overnight, awake and interactive  VITAL SIGNS: Vital signs reviewed. Abnormal values will appear under impression plan section.   INTAKE / OUTPUT: No intake or output data in the 24 hours ending 05/12/14 [id number]  PHYSICAL EXAMINATION: General: chronically ill in NAD, MO trached No distress on ATC x 48 hours.   alopecia noted Neuro: Awake and interactive HEENT: trach, mm pink/moist,   Lungs: Diminished throughout Abdomen: Obese, mildly distended, decreased BS but present, non tender , no rebound or guarding. PEG in place with TF running Ext: BUE edema, no LE edema  LABS:  CBC  Recent Labs Lab 05/07/14 0500 05/09/14 0500  WBC 12.3* 14.6*  HGB 8.9* 10.1*  HCT 29.9* 33.0*  PLT 298 309   Coag's  Recent Labs Lab 05/10/14 0500 05/11/14 0550 05/12/14 0500  INR 2.00* 1.74* 1.92*   BMET  Recent Labs Lab 05/07/14 0500 05/09/14 0500 05/11/14 0550  NA 143 139 141  K 3.5* 3.5* 3.5*  CL 94* 89* 89*  CO2 43* 41* 44*  BUN 56* 46* 47*  CREATININE 0.96 0.75 0.80  GLUCOSE 237* 265* 198*   Electrolytes  Recent Labs Lab 05/07/14 0500 05/09/14 0500 05/11/14 0550  CALCIUM 9.3 8.9 8.6  MG  --   --  1.7   Imaging No results found.   ASSESSMENT / PLAN:   OETT 10/20 ([name])>>> 04/01/14 ([place])>>11/3,  Reintubated 11/9>>11/12 Trached 11/12>>  Assessment  Acute on chronic respiratory failure requiring repeated and prolonged ventilation - r/t Group B strep CAP followed by MRSA PNA.  Suspected baseline OSA/OHS Mild AKI Hypokalemia  Anemia of critical illness  Discussion  She has remained off vent x 48 hours  Plan:  Goal is TC for 24/7. No decannulation given overall physical condition. PCCM will sign off 12/14, please call if needed.  Brett CanalesSteve Minor ACNP [facility] P[facility]M Pager [phone] till 3 pm If no answer page [phone] 05/12/2014, 9:23 AM   PCCM ATTENDING: Pt seen on work rounds with care provider noted above. We reviewed pt's initial presentation, consultants notes and hospital database in detail.  The above assessment and plan was formulated under my direction.  Presently tolerating ATC 24 hrs per day. PCCM will sign off. Please call if we can be of further assistance  Billy Fischeravid Aleyna Cueva, MD;  PCCM service; Mobile [phone]

## 2014-05-13 LAB — CBC
HEMATOCRIT: 36 % (ref 36.0–46.0)
Hemoglobin: 11 g/dL — ABNORMAL LOW (ref 12.0–15.0)
MCH: 30 pg (ref 26.0–34.0)
MCHC: 30.6 g/dL (ref 30.0–36.0)
MCV: 98.1 fL (ref 78.0–100.0)
Platelets: 306 10*3/uL (ref 150–400)
RBC: 3.67 MIL/uL — ABNORMAL LOW (ref 3.87–5.11)
RDW: 14.4 % (ref 11.5–15.5)
WBC: 19.8 10*3/uL — ABNORMAL HIGH (ref 4.0–10.5)

## 2014-05-13 LAB — PROTIME-INR
INR: 2.58 — ABNORMAL HIGH (ref 0.00–1.49)
Prothrombin Time: 27.9 seconds — ABNORMAL HIGH (ref 11.6–15.2)

## 2014-05-14 LAB — PROTIME-INR
INR: 2.86 — ABNORMAL HIGH (ref 0.00–1.49)
Prothrombin Time: 30.2 seconds — ABNORMAL HIGH (ref 11.6–15.2)

## 2014-05-15 LAB — CBC
HCT: 34.8 % — ABNORMAL LOW (ref 36.0–46.0)
Hemoglobin: 10.7 g/dL — ABNORMAL LOW (ref 12.0–15.0)
MCH: 29.9 pg (ref 26.0–34.0)
MCHC: 30.7 g/dL (ref 30.0–36.0)
MCV: 97.2 fL (ref 78.0–100.0)
Platelets: 257 10*3/uL (ref 150–400)
RBC: 3.58 MIL/uL — AB (ref 3.87–5.11)
RDW: 14.5 % (ref 11.5–15.5)
WBC: 19.9 10*3/uL — AB (ref 4.0–10.5)

## 2014-05-15 LAB — PROTIME-INR
INR: 3.27 — ABNORMAL HIGH (ref 0.00–1.49)
Prothrombin Time: 33.5 seconds — ABNORMAL HIGH (ref 11.6–15.2)

## 2014-05-16 LAB — CBC WITH DIFFERENTIAL/PLATELET
Basophils Absolute: 0 10*3/uL (ref 0.0–0.1)
Basophils Relative: 0 % (ref 0–1)
EOS ABS: 0.1 10*3/uL (ref 0.0–0.7)
Eosinophils Relative: 1 % (ref 0–5)
HCT: 35.8 % — ABNORMAL LOW (ref 36.0–46.0)
HEMOGLOBIN: 10.7 g/dL — AB (ref 12.0–15.0)
LYMPHS PCT: 6 % — AB (ref 12–46)
Lymphs Abs: 1 10*3/uL (ref 0.7–4.0)
MCH: 29.2 pg (ref 26.0–34.0)
MCHC: 29.9 g/dL — ABNORMAL LOW (ref 30.0–36.0)
MCV: 97.8 fL (ref 78.0–100.0)
MONOS PCT: 10 % (ref 3–12)
Monocytes Absolute: 1.7 10*3/uL — ABNORMAL HIGH (ref 0.1–1.0)
Neutro Abs: 14.6 10*3/uL — ABNORMAL HIGH (ref 1.7–7.7)
Neutrophils Relative %: 83 % — ABNORMAL HIGH (ref 43–77)
Platelets: 249 10*3/uL (ref 150–400)
RBC: 3.66 MIL/uL — ABNORMAL LOW (ref 3.87–5.11)
RDW: 14.7 % (ref 11.5–15.5)
WBC: 17.4 10*3/uL — AB (ref 4.0–10.5)

## 2014-05-16 LAB — PROTIME-INR
INR: 2.84 — ABNORMAL HIGH (ref 0.00–1.49)
Prothrombin Time: 30.1 seconds — ABNORMAL HIGH (ref 11.6–15.2)

## 2014-05-30 DEATH — deceased

## 2014-06-30 DEATH — deceased

## 2015-09-14 IMAGING — CR DG CHEST 1V PORT
1 series · 1 of 1 positions shown · non-contrast
Comparison: Portable chest x-ray March 31, 2014

CLINICAL DATA: Acute respiratory failure ; history of diabetes,
atrial fibrillation, and morbid obesity

EXAM:
PORTABLE CHEST - 1 VIEW

[AP]
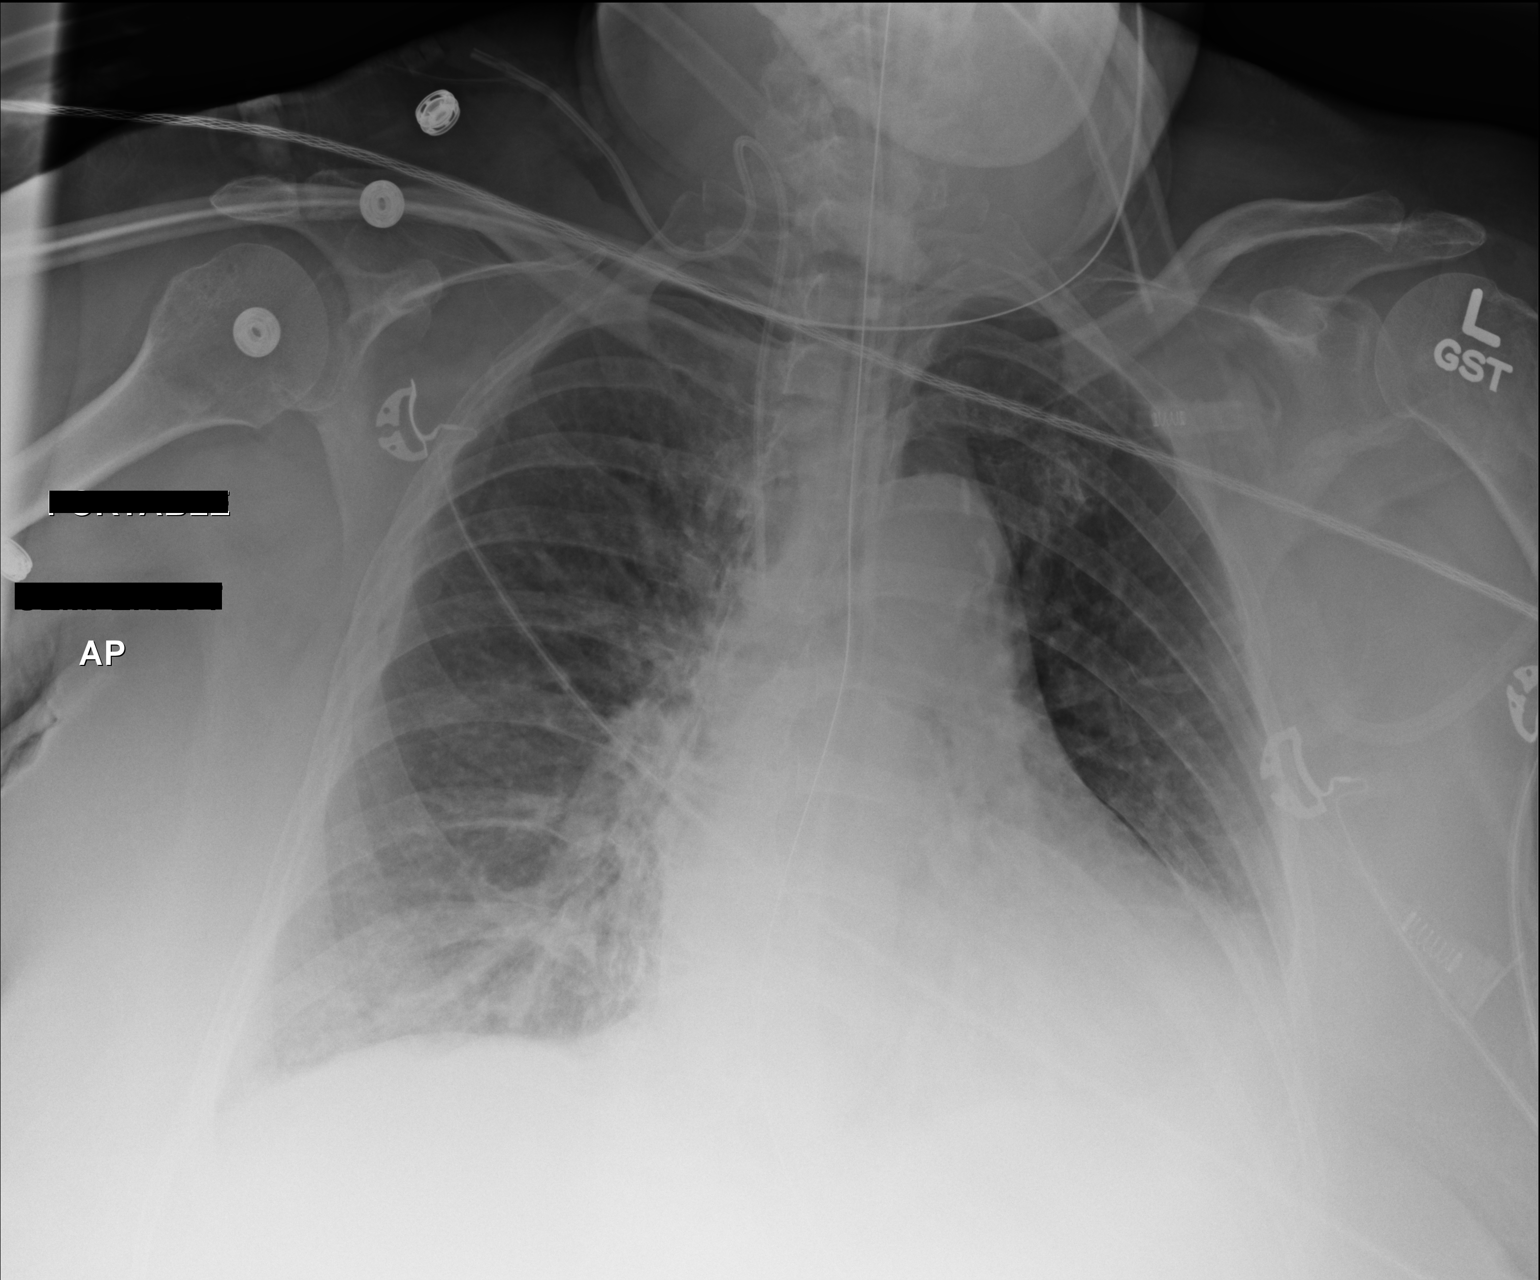

[1 of 1 positions shown; findings below may reference images not displayed]

FINDINGS: The lungs remain well-expanded. The interstitial markings have
increased bilaterally. The left hemidiaphragm is less well
demonstrated today. The cardiopericardial silhouette remains
enlarged. The pulmonary vascularity is more engorged centrally
today.

The endotracheal tube tip lies approximately 3.5 cm above the crotch
of the carina. The esophagogastric tube tip projects below the
inferior margin of the image. The right internal jugular venous
catheter tip projects over the junction of the proximal and
midportions of the SVC.
IMPRESSION: There has been interval deterioration in the appearance of the
pulmonary interstitium and the lung bases consistent with
interstitial edema and bibasilar atelectasis.

## 2015-09-15 IMAGING — CR DG ABD PORTABLE 1V
1 series · 1 of 1 positions shown · non-contrast
Comparison: 03/25/2014

CLINICAL DATA: Feeding tube placement.

EXAM:
PORTABLE ABDOMEN - 1 VIEW

[AP]
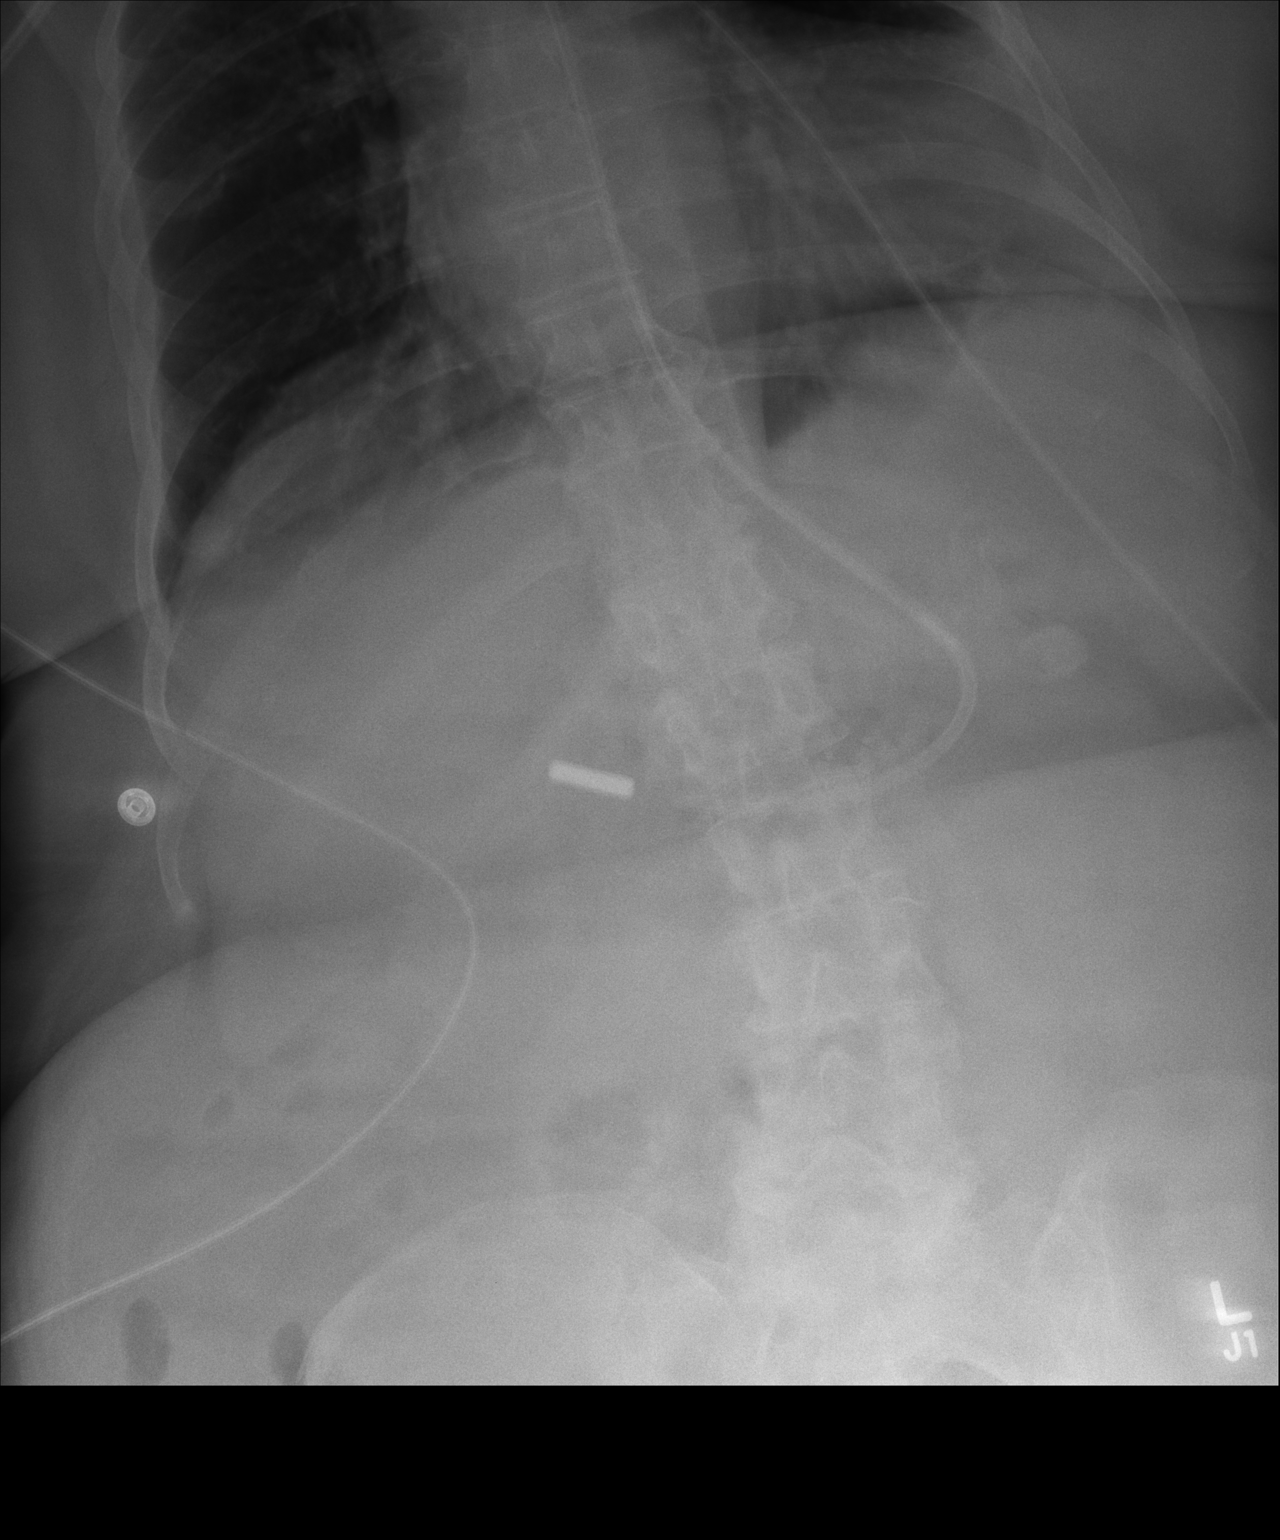

[1 of 1 positions shown; findings below may reference images not displayed]

FINDINGS: Feeding tube tip overlies the level of the distal stomach. Bowel gas
pattern is nonobstructive.
IMPRESSION: Feeding tube tip overlying the level of the distal stomach.

## 2015-09-20 IMAGING — CR DG CHEST 1V PORT
2 series · 2 of 2 positions shown · non-contrast
Comparison: Chest 04/07/2014 at [DATE] a.m.

CLINICAL DATA: Intubation.

EXAM:
PORTABLE CHEST - 1 VIEW

[AP (1 of 2)]
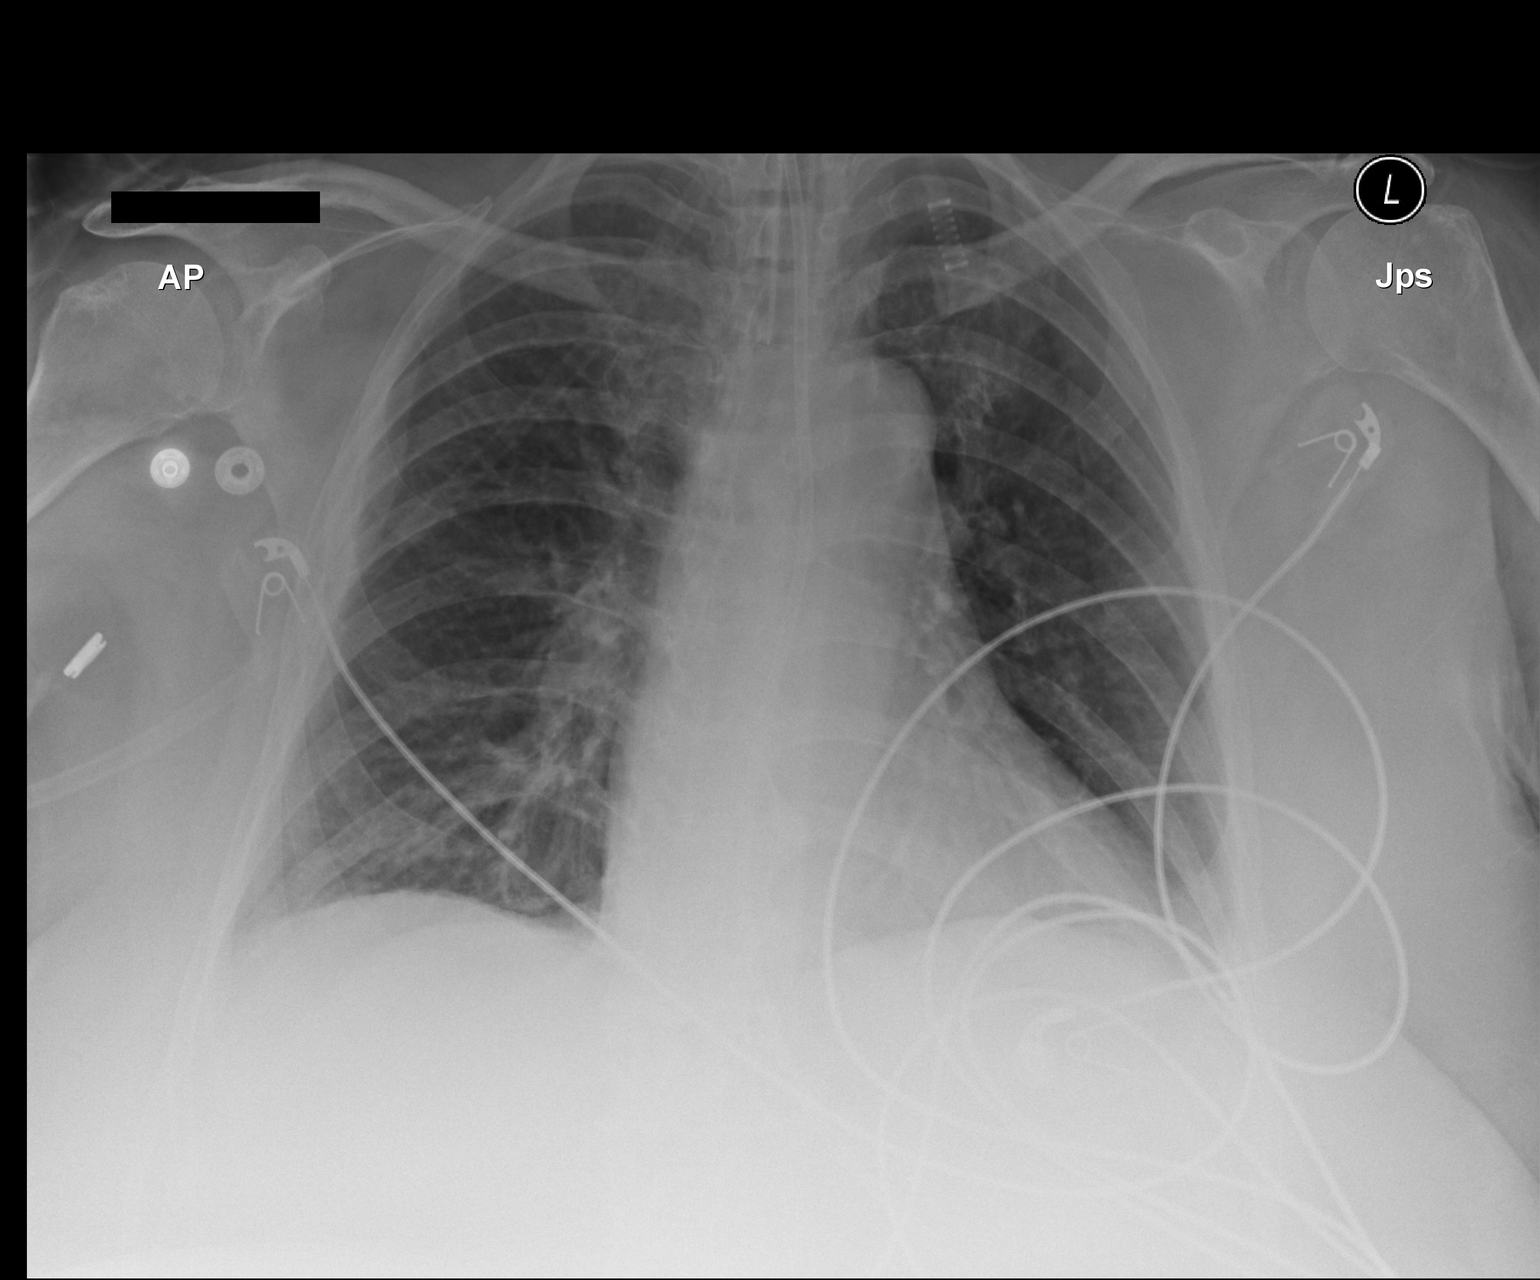

[AP (2 of 2)]
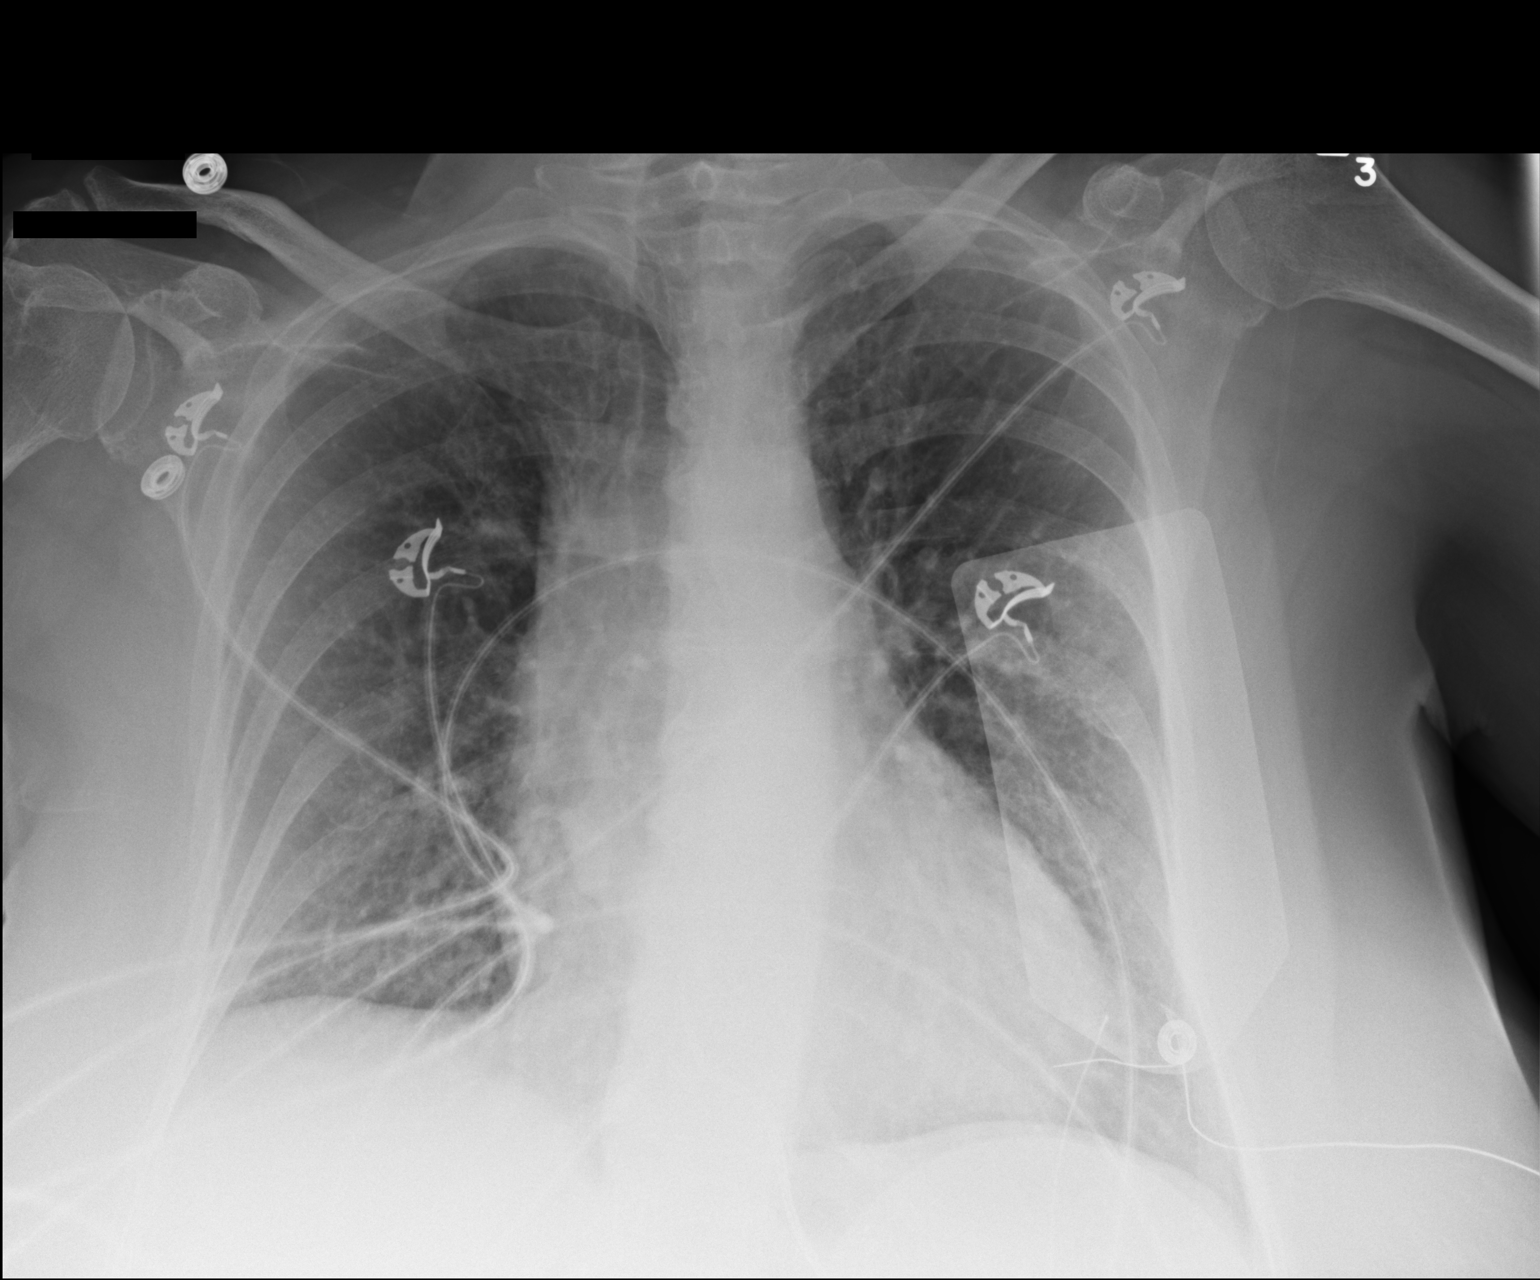

[2 of 2 positions shown; findings below may reference images not displayed]

FINDINGS: New endotracheal tube is in place the tip in good position at the
level of the knee clavicular heads. Feeding tube courses into the
stomach and below the inferior margin of the film. Lungs are clear.
Heart size is normal. No pneumothorax or pleural effusion.
IMPRESSION: ET tube in good position.  No acute disease.

## 2015-09-23 IMAGING — CR DG CHEST 1V PORT
1 series · 1 of 1 positions shown · non-contrast
Comparison: 04/09/2014

CLINICAL DATA: Status post tracheostomy placement

EXAM:
PORTABLE CHEST - 1 VIEW

[AP]
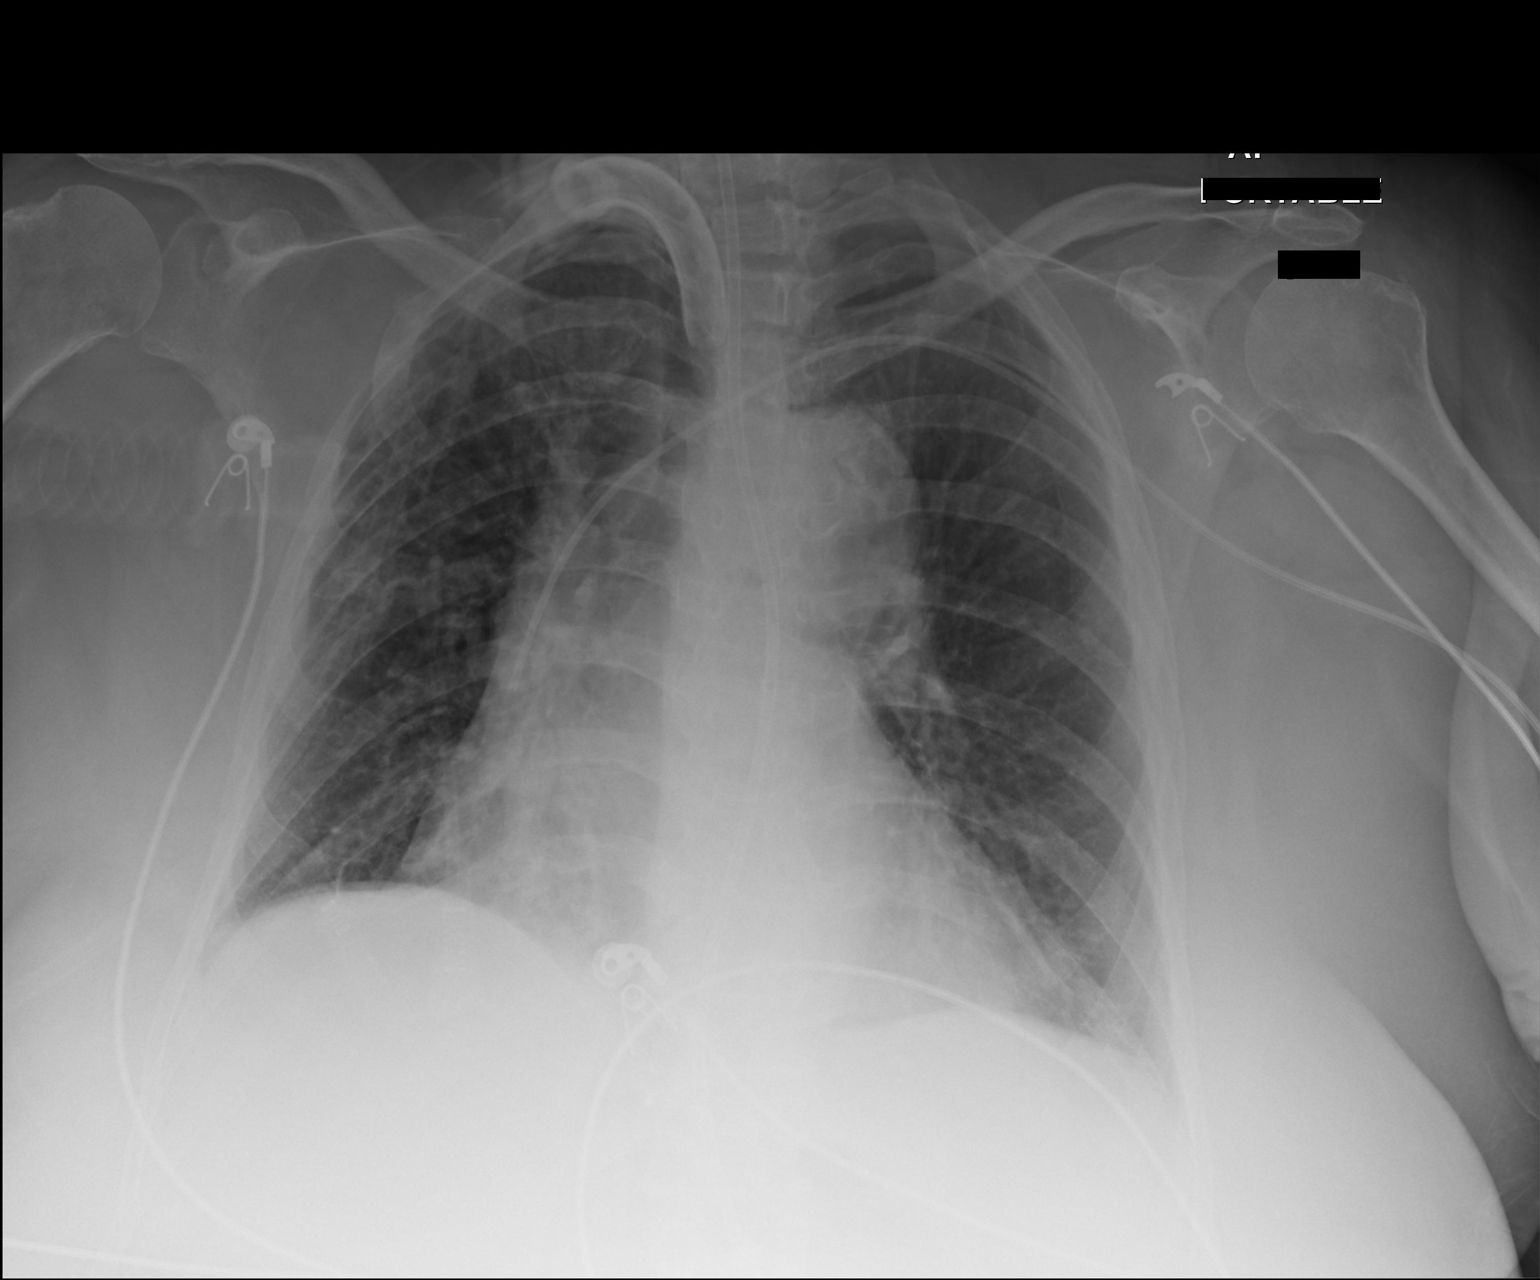

[1 of 1 positions shown; findings below may reference images not displayed]

FINDINGS: Cardiac shadow is again enlarged. A left-sided PICC line is stable
in appearance. Endotracheal tube is been removed and a tracheostomy
tube placed in satisfactory position. A feeding catheter is seen
within the stomach. The lungs demonstrate mild interstitial changes
but are stable in appearance.
IMPRESSION: Tracheostomy in satisfactory position. The remainder of the exam is
stable from the previous day.

## 2024-06-13 ENCOUNTER — Other Ambulatory Visit: Payer: Self-pay
